# Patient Record
Sex: Female | Born: 1965 | Race: White | Hispanic: No | Smoking: Current some day smoker
Health system: Southern US, Community
[De-identification: ages and names within clinical notes are randomized; demographics above are authoritative.]

## PROBLEM LIST (undated history)

## (undated) DIAGNOSIS — M1990 Unspecified osteoarthritis, unspecified site: Secondary | ICD-10-CM

## (undated) DIAGNOSIS — R51 Headache: Secondary | ICD-10-CM

## (undated) DIAGNOSIS — E349 Endocrine disorder, unspecified: Secondary | ICD-10-CM

## (undated) DIAGNOSIS — F329 Major depressive disorder, single episode, unspecified: Secondary | ICD-10-CM

## (undated) DIAGNOSIS — F419 Anxiety disorder, unspecified: Secondary | ICD-10-CM

## (undated) DIAGNOSIS — T4145XA Adverse effect of unspecified anesthetic, initial encounter: Secondary | ICD-10-CM

## (undated) DIAGNOSIS — F32A Depression, unspecified: Secondary | ICD-10-CM

## (undated) DIAGNOSIS — N939 Abnormal uterine and vaginal bleeding, unspecified: Secondary | ICD-10-CM

## (undated) DIAGNOSIS — T8859XA Other complications of anesthesia, initial encounter: Secondary | ICD-10-CM

## (undated) HISTORY — DX: Anxiety disorder, unspecified: F41.9

## (undated) HISTORY — PX: GYNECOLOGIC CRYOSURGERY: SHX857

## (undated) HISTORY — DX: Endocrine disorder, unspecified: E34.9

## (undated) HISTORY — DX: Abnormal uterine and vaginal bleeding, unspecified: N93.9

---

## 1998-03-06 ENCOUNTER — Other Ambulatory Visit: Admission: RE | Admit: 1998-03-06 | Discharge: 1998-03-06 | Payer: Self-pay | Admitting: Obstetrics and Gynecology

## 1999-03-11 ENCOUNTER — Other Ambulatory Visit: Admission: RE | Admit: 1999-03-11 | Discharge: 1999-03-11 | Payer: Self-pay | Admitting: Obstetrics and Gynecology

## 1999-10-07 ENCOUNTER — Inpatient Hospital Stay (HOSPITAL_COMMUNITY): Admission: AD | Admit: 1999-10-07 | Discharge: 1999-10-11 | Payer: Self-pay | Admitting: Obstetrics and Gynecology

## 1999-10-28 ENCOUNTER — Encounter: Admission: RE | Admit: 1999-10-28 | Discharge: 2000-01-26 | Payer: Self-pay | Admitting: Gynecology

## 1999-11-05 ENCOUNTER — Other Ambulatory Visit: Admission: RE | Admit: 1999-11-05 | Discharge: 1999-11-05 | Payer: Self-pay | Admitting: Gynecology

## 2000-12-29 ENCOUNTER — Other Ambulatory Visit: Admission: RE | Admit: 2000-12-29 | Discharge: 2000-12-29 | Payer: Self-pay | Admitting: Gynecology

## 2002-01-09 ENCOUNTER — Other Ambulatory Visit: Admission: RE | Admit: 2002-01-09 | Discharge: 2002-01-09 | Payer: Self-pay | Admitting: Gynecology

## 2003-01-24 ENCOUNTER — Other Ambulatory Visit: Admission: RE | Admit: 2003-01-24 | Discharge: 2003-01-24 | Payer: Self-pay | Admitting: Gynecology

## 2004-03-19 ENCOUNTER — Other Ambulatory Visit: Admission: RE | Admit: 2004-03-19 | Discharge: 2004-03-19 | Payer: Self-pay | Admitting: Gynecology

## 2004-11-26 ENCOUNTER — Ambulatory Visit: Payer: Self-pay | Admitting: Internal Medicine

## 2005-02-02 ENCOUNTER — Ambulatory Visit: Payer: Self-pay | Admitting: Internal Medicine

## 2005-03-11 ENCOUNTER — Ambulatory Visit: Payer: Self-pay | Admitting: Internal Medicine

## 2005-03-18 ENCOUNTER — Ambulatory Visit: Payer: Self-pay | Admitting: Internal Medicine

## 2005-05-07 ENCOUNTER — Other Ambulatory Visit: Admission: RE | Admit: 2005-05-07 | Discharge: 2005-05-07 | Payer: Self-pay | Admitting: Gynecology

## 2006-04-06 ENCOUNTER — Ambulatory Visit: Payer: Self-pay | Admitting: Internal Medicine

## 2006-06-07 ENCOUNTER — Other Ambulatory Visit: Admission: RE | Admit: 2006-06-07 | Discharge: 2006-06-07 | Payer: Self-pay | Admitting: Gynecology

## 2007-05-11 DIAGNOSIS — J309 Allergic rhinitis, unspecified: Secondary | ICD-10-CM | POA: Insufficient documentation

## 2007-05-11 DIAGNOSIS — F39 Unspecified mood [affective] disorder: Secondary | ICD-10-CM | POA: Insufficient documentation

## 2007-07-21 ENCOUNTER — Other Ambulatory Visit: Admission: RE | Admit: 2007-07-21 | Discharge: 2007-07-21 | Payer: Self-pay | Admitting: Gynecology

## 2007-09-08 ENCOUNTER — Telehealth: Payer: Self-pay | Admitting: Internal Medicine

## 2011-01-16 NOTE — Discharge Summary (Signed)
[facility] [place]  Patient:    Margaret Brady, Margaret Brady                 MRN: [id number] Adm. Date:  [id number] Disc. Date: [id number] Attending:  Douglass Rivers Dictator:   Gwendolyn Fill. [place], P.A.                           Discharge Summary  DISCHARGE DIAGNOSES:          1. Intrauterine pregnancy at 39 weeks.                               2. Homero Fellers breech presentation in labor.                               3. Status post primary cesarean section low flap                                  transverse by Douglass Rivers, M.D. on February ,                                  2001.                               4. Rh negative.  HISTORY OF PRESENT ILLNESS:   This is a 45 year old female, gravida 2, para 0, abortus 1, with an EDC of October 16, 1999, whose prenatal course had been uncomplicated except for being rh negative and received RhoGAM in pregnancy.  HOSPITAL COURSE:              On October 08, 1999, the patient presented at 39 weeks with spontaneous rupture of membranes in labor and breech presentation. Therefore, the patient underwent a primary cesarean section low flap transverse on October 08, 1999, and at 0604, underwent delivery of a female, Apgars of 8 and , and weight of 7 pounds 1 ounce.  There were no complications.  Postoperatively, the patient remained afebrile, voiding, and in stable condition.  She was discharged to home on October 11, 1999, and given [facility] postpartum instructions as well as postpartum booklet.  ACCESSORY CLINICAL DATA:      The patient is A negative, rubella immune.  On October 09, 1999, hemoglobin 12.2, hematocrit 34.3.  DISPOSITION:                  The patient was discharged to home and informed to return to the office in six weeks and if she has any problems prior to that time to be seen in the office.  She was given Tylox #40 p.r.n. pain.  She is to receive  RhoGAM prior to discharge. DD:   11/03/99 TD:  11/03/99 Job: [id number]

## 2011-01-16 NOTE — Op Note (Signed)
[facility]  Patient:    Margaret Brady, Margaret Brady                 MRN: [id number] Proc. Date: 10/08/99 Adm. Date:  [id number] Attending:  Douglass Rivers                           Operative Report  PREOPERATIVE DIAGNOSIS:       Homero Fellers breech presentation.  Labor.  POSTOPERATIVE DIAGNOSIS:      Homero Fellers breech presentation.  Labor.  OPERATION:                    Primary cesarean section low flap transverse.  SURGEON:                      Douglass Rivers, M.D.  ASSISTANT:                    Bernette Redbird, M.D.  ANESTHESIA:                   Epidural.  ESTIMATED BLOOD LOSS:         600 cc.  FINDINGS:                     A viable female infant in the frank breech presentation.  Apgars were 8 and 9.  Birth weight was 3200 grams.  Normal uterus, tubes, and ovaries.  COMPLICATIONS:                None.  DESCRIPTION OF PROCEDURE:     The patient was taken to the operating room and placed in the supine position with a left lateral displacement.  She was prepped and draped in the usual sterile fashion.  After adequate anesthesia was ensured, a Pfannenstiel skin incision was made with the scalpel and carried through the underlying layer of fascia with the second blade.  The fascia was scored in the  midline and the incision was extended laterally with the Mayo scissors.  The inferior aspect of the fascial incision was grasped with Kochers.  Underlying rectus muscles were dissected off by blunt and sharp dissection.  In a similar fashion, the superior aspect of the fascial incision was grasped with Kochers and the underlying rectus muscles were separated off.  The rectus muscles were separated in the midline and the peritoneum was identified, tented up, and entered by sharp dissection.  The peritoneal incision was then extended superiorly and inferiorly with good visualization of underlying bowel and bladder.  The orientation of the uterus was confirmed,  the vesicouterine peritoneum was identified, tented up, and entered sharply with the Metzenbaum scissors.  The incision was extended laterally.  The bladder flap was created digitally.  The bladder blade was reinserted and the lower uterine segment was incised in a transverse fashion with the scalpel.  The infant was delivered from the frank breech presentation using the usual maneuvers.  The cord was cut and clamped and she was handed off to the awaiting pediatrician.  Cord bloods were obtained. The uterus was massaged.  The placenta was allowed to separate naturally.  The uterus was then cleared of all clots and debris.  The uterine incision was then closed  with a running locking suture of 0 chromic.  The gutters were cleared of all clots and debris.  The adnexa were inspected.  The incision was noted to be hemostatic as  was the peritoneum and muscle.  The pelvis was irrigated with copious amounts of warm saline.  The fascia was then closed with 0 Vicryl starting at the apex and  moving towards the midline bilaterally.  The subcu was irrigated.  The skin was  closed with staples.  The patient tolerated the procedure well.  Sponge, needle, and instrument counts were correct x 2.  She was given Ancef intraoperatively and transferred to PACU in stable condition. DD:  10/08/99 TD:  10/08/99 Job: [id number]

## 2013-11-09 ENCOUNTER — Other Ambulatory Visit: Payer: Self-pay | Admitting: Family Medicine

## 2013-11-09 ENCOUNTER — Ambulatory Visit
Admission: RE | Admit: 2013-11-09 | Discharge: 2013-11-09 | Disposition: A | Discharge status: Home / Self Care | Payer: No Typology Code available for payment source | Source: Ambulatory Visit | Attending: Family Medicine | Admitting: Family Medicine

## 2013-11-09 DIAGNOSIS — R945 Abnormal results of liver function studies: Secondary | ICD-10-CM

## 2013-11-09 DIAGNOSIS — R109 Unspecified abdominal pain: Secondary | ICD-10-CM

## 2013-11-09 DIAGNOSIS — R7989 Other specified abnormal findings of blood chemistry: Secondary | ICD-10-CM

## 2013-11-13 ENCOUNTER — Encounter (INDEPENDENT_AMBULATORY_CARE_PROVIDER_SITE_OTHER): Payer: Self-pay | Admitting: General Surgery

## 2013-11-13 ENCOUNTER — Telehealth (INDEPENDENT_AMBULATORY_CARE_PROVIDER_SITE_OTHER): Payer: Self-pay

## 2013-11-13 ENCOUNTER — Ambulatory Visit (INDEPENDENT_AMBULATORY_CARE_PROVIDER_SITE_OTHER): Payer: Self-pay | Admitting: General Surgery

## 2013-11-13 VITALS — BP 122/88 | HR 72 | Temp 98.2°F | Resp 16 | Ht 64.0 in | Wt 162.2 lb

## 2013-11-13 DIAGNOSIS — K8020 Calculus of gallbladder without cholecystitis without obstruction: Secondary | ICD-10-CM

## 2013-11-13 NOTE — Telephone Encounter (Signed)
Office notes faxed to Dr. Electa SniffBarnett from 11/13/13 OV with Dr. Derrell Lollingamirez @ [phone]. Fax confirmation rec'd.

## 2013-11-13 NOTE — Progress Notes (Signed)
Patient ID: Margaret Brady, female   DOB: 1966-07-14, 48 y.o.   MRN: [id number]  Chief Complaint  Patient presents with  . New Evaluation    Gallstones    HPI Margaret Brady is a 48 y.o. female.  The patient is a 48 year old female by Dr. Electa SniffBarnett for evaluation of symptomatic cholelithiasis. The patient states she's had a one-week history of right upper quadrant epigastric pain that radiates to the back. The patient undergone ultrasound which revealed multiple gallstones and a normal common bile duct. The patient has had laboratory studies revealed elevated transaminases with a normal T. Bili.    HPI  History reviewed. No pertinent past medical history.  Past Surgical History  Procedure Laterality Date  . Cesarean section      2001.    History reviewed. No pertinent family history.  Social History History  Substance Use Topics  . Smoking status: Current Some Day Smoker  . Smokeless tobacco: Never Used  . Alcohol Use: No    No Known Allergies  Current Outpatient Prescriptions  Medication Sig Dispense Refill  . norethindrone-ethinyl estradiol (FEMHRT 1/5) 1-5 MG-MCG TABS Take by mouth daily.       No current facility-administered medications for this visit.    Review of Systems Review of Systems  Constitutional: Negative.   HENT: Negative.   Respiratory: Negative.   Cardiovascular: Negative.   Gastrointestinal: Negative.   Neurological: Negative.   All other systems reviewed and are negative.    Blood pressure 122/88, pulse 72, temperature 98.2 F (36.8 C), temperature source Temporal, resp. rate 16, height 5\' 4"  (1.626 m), weight 162 lb 3.2 oz (73.573 kg).  Physical Exam Physical Exam  Constitutional: She is oriented to person, place, and time. She appears well-developed and well-nourished.  HENT:  Head: Normocephalic and atraumatic.  Eyes: Conjunctivae and EOM are normal. Pupils are equal, round, and reactive to light.  Neck: Normal range of motion. Neck  supple.  Cardiovascular: Normal rate, regular rhythm and normal heart sounds.   Pulmonary/Chest: Effort normal and breath sounds normal.  Abdominal: Soft. Bowel sounds are normal. She exhibits no distension and no mass. There is tenderness (ruq). There is no rebound and no guarding.  Musculoskeletal: Normal range of motion.  Neurological: She is alert and oriented to person, place, and time.  Skin: Skin is warm and dry.  Psychiatric: She has a normal mood and affect.    Data Reviewed As above  Assessment    48 year old female with symptomatic cholelithiasis     Plan    1. We'll proceed to the operating room for a laparoscopic cholecystectomy with IOC 2. All risks and benefits were discussed with the patient to generally include: infection, bleeding, possible need for post op ERCP, damage to the bile ducts, and bile leak. Alternatives were offered and described.  All questions were answered and the patient voiced understanding of the procedure and wishes to proceed at this point with a laparoscopic cholecystectomy         Marigene EhlersRamirez Jr., Jed LimerickArmando 11/13/2013, 2:42 PM

## 2013-11-14 ENCOUNTER — Encounter (HOSPITAL_COMMUNITY): Payer: Self-pay | Admitting: Pharmacy Technician

## 2013-11-14 NOTE — Pre-Procedure Instructions (Addendum)
Margaret Brady  11/14/2013   Your procedure is scheduled on:  11/16/13  [facility] at 530 AM.  Call this number if you have problems the morning of surgery: [phone]   Remember:   Do not eat food or drink liquids after midnight.   Take these medicines the morning of surgery with A SIP OF WATER: tylenol if needed, birth control        STOP all herbel meds, nsaids (aleve,naproxen,advil,ibuprofen) now including vitamins,aspirin   Do not wear jewelry, make-up or nail polish.  Do not wear lotions, powders, or perfumes. You may wear deodorant.  Do not shave 48 hours prior to surgery. Men may shave face and neck.  Do not bring valuables to the hospital.  [facility] is not responsible                  for any belongings or valuables.               Contacts, dentures or bridgework may not be worn into surgery.  Leave suitcase in the car. After surgery it may be brought to your room.  For patients admitted to the hospital, discharge time is determined by your                treatment team.               Patients discharged the day of surgery will not be allowed to drive  home.  Name and phone number of your driver:   Special Instructions:  Special Instructions: [place] - Preparing for Surgery  Before surgery, you can play an important role.  Because skin is not sterile, your skin needs to be as free of germs as possible.  You can reduce the number of germs on you skin by washing with CHG (chlorahexidine gluconate) soap before surgery.  CHG is an antiseptic cleaner which kills germs and bonds with the skin to continue killing germs even after washing.  Please DO NOT use if you have an allergy to CHG or antibacterial soaps.  If your skin becomes reddened/irritated stop using the CHG and inform your nurse when you arrive at Short Stay.  Do not shave (including legs and underarms) for at least 48 hours prior to the first CHG shower.  You may shave your  face.  Please follow these instructions carefully:   1.  Shower with CHG Soap the night before surgery and the morning of Surgery.  2.  If you choose to wash your hair, wash your hair first as usual with your normal shampoo.  3.  After you shampoo, rinse your hair and body thoroughly to remove the Shampoo.  4.  Use CHG as you would any other liquid soap.  You can apply chg directly  to the skin and wash gently with scrungie or a clean washcloth.  5.  Apply the CHG Soap to your body ONLY FROM THE NECK DOWN.  Do not use on open wounds or open sores.  Avoid contact with your eyes ears, mouth and genitals (private parts).  Wash genitals (private parts)       with your normal soap.  6.  Wash thoroughly, paying special attention to the area where your surgery will be performed.  7.  Thoroughly rinse your body with warm water from the neck down.  8.  DO NOT shower/wash with your normal soap after using and rinsing off the CHG Soap.  9.  Pat yourself dry with a clean towel.            10.  Wear clean pajamas.            11.  Place clean sheets on your bed the night of your first shower and do not sleep with pets.  Day of Surgery  Do not apply any lotions/deodorants the morning of surgery.  Please wear clean clothes to the hospital/surgery center.   Please read over the following fact sheets that you were given: Pain Booklet, Coughing and Deep Breathing and Surgical Site Infection Prevention

## 2013-11-15 ENCOUNTER — Encounter (HOSPITAL_COMMUNITY): Payer: Self-pay

## 2013-11-15 ENCOUNTER — Encounter (HOSPITAL_COMMUNITY)
Admission: RE | Admit: 2013-11-15 | Discharge: 2013-11-15 | Disposition: A | Discharge status: Home / Self Care | Payer: Medicaid Other | Source: Ambulatory Visit | Attending: General Surgery | Admitting: General Surgery

## 2013-11-15 DIAGNOSIS — Z01812 Encounter for preprocedural laboratory examination: Secondary | ICD-10-CM | POA: Diagnosis present

## 2013-11-15 HISTORY — DX: Other complications of anesthesia, initial encounter: T88.59XA

## 2013-11-15 HISTORY — DX: Adverse effect of unspecified anesthetic, initial encounter: T41.45XA

## 2013-11-15 HISTORY — DX: Depression, unspecified: F32.A

## 2013-11-15 HISTORY — DX: Major depressive disorder, single episode, unspecified: F32.9

## 2013-11-15 HISTORY — DX: Headache: R51

## 2013-11-15 HISTORY — DX: Unspecified osteoarthritis, unspecified site: M19.90

## 2013-11-15 LAB — BASIC METABOLIC PANEL
BUN: 12 mg/dL (ref 6–23)
CALCIUM: 10 mg/dL (ref 8.4–10.5)
CO2: 24 mEq/L (ref 19–32)
CREATININE: 0.75 mg/dL (ref 0.50–1.10)
Chloride: 103 mEq/L (ref 96–112)
GFR calc Af Amer: >90 mL/min (ref >90)
Glucose, Bld: 96 mg/dL (ref 70–99)
POTASSIUM: 5.1 meq/L (ref 3.7–5.3)
Sodium: 140 mEq/L (ref 137–147)

## 2013-11-15 LAB — CBC
HCT: 43.4 % (ref 36.0–46.0)
HEMOGLOBIN: 15.8 g/dL — AB (ref 12.0–15.0)
MCH: 36.7 pg — ABNORMAL HIGH (ref 26.0–34.0)
MCHC: 36.4 g/dL — AB (ref 30.0–36.0)
MCV: 100.9 fL — ABNORMAL HIGH (ref 78.0–100.0)
Platelets: 267 10*3/uL (ref 150–400)
RBC: 4.3 MIL/uL (ref 3.87–5.11)
RDW: 12.2 % (ref 11.5–15.5)
WBC: 7 10*3/uL (ref 4.0–10.5)

## 2013-11-15 LAB — HCG, SERUM, QUALITATIVE: PREG SERUM: NEGATIVE

## 2013-11-15 MED ORDER — CEFAZOLIN SODIUM-DEXTROSE 2-3 GM-% IV SOLR
2.0000 g | INTRAVENOUS | Status: AC
  Administered 2013-11-16: 2 g via INTRAVENOUS
  Filled 2013-11-15: qty 50

## 2013-11-16 ENCOUNTER — Inpatient Hospital Stay (HOSPITAL_COMMUNITY): Payer: Medicaid Other | Admitting: Certified Registered"

## 2013-11-16 ENCOUNTER — Inpatient Hospital Stay (HOSPITAL_COMMUNITY): Payer: Medicaid Other

## 2013-11-16 ENCOUNTER — Encounter (HOSPITAL_COMMUNITY): Payer: Self-pay | Admitting: *Deleted

## 2013-11-16 ENCOUNTER — Encounter (HOSPITAL_COMMUNITY): Payer: Medicaid Other | Admitting: Certified Registered"

## 2013-11-16 ENCOUNTER — Ambulatory Visit (HOSPITAL_COMMUNITY): Payer: Medicaid Other | Admitting: Anesthesiology

## 2013-11-16 ENCOUNTER — Encounter (HOSPITAL_COMMUNITY)
Admission: RE | Disposition: A | Discharge status: Home / Self Care | Payer: Self-pay | Source: Ambulatory Visit | Attending: General Surgery

## 2013-11-16 ENCOUNTER — Inpatient Hospital Stay (HOSPITAL_COMMUNITY)
Admission: RE | Admit: 2013-11-16 | Discharge: 2013-11-17 | DRG: 419 | Disposition: A | Discharge status: Home / Self Care | Payer: Medicaid Other | Source: Ambulatory Visit | Attending: General Surgery | Admitting: General Surgery

## 2013-11-16 ENCOUNTER — Ambulatory Visit (HOSPITAL_COMMUNITY): Payer: Medicaid Other

## 2013-11-16 ENCOUNTER — Encounter (HOSPITAL_COMMUNITY): Payer: Medicaid Other | Admitting: Anesthesiology

## 2013-11-16 DIAGNOSIS — K8050 Calculus of bile duct without cholangitis or cholecystitis without obstruction: Secondary | ICD-10-CM

## 2013-11-16 DIAGNOSIS — Z79899 Other long term (current) drug therapy: Secondary | ICD-10-CM

## 2013-11-16 DIAGNOSIS — Z9049 Acquired absence of other specified parts of digestive tract: Secondary | ICD-10-CM

## 2013-11-16 DIAGNOSIS — K8070 Calculus of gallbladder and bile duct without cholecystitis without obstruction: Principal | ICD-10-CM | POA: Diagnosis present

## 2013-11-16 DIAGNOSIS — K8010 Calculus of gallbladder with chronic cholecystitis without obstruction: Secondary | ICD-10-CM

## 2013-11-16 DIAGNOSIS — K8020 Calculus of gallbladder without cholecystitis without obstruction: Secondary | ICD-10-CM

## 2013-11-16 HISTORY — PX: ERCP: SHX5425

## 2013-11-16 HISTORY — PX: CHOLECYSTECTOMY: SHX55

## 2013-11-16 SURGERY — ERCP, WITH INTERVENTION IF INDICATED
Anesthesia: General

## 2013-11-16 SURGERY — LAPAROSCOPIC CHOLECYSTECTOMY WITH INTRAOPERATIVE CHOLANGIOGRAM
Anesthesia: General

## 2013-11-16 MED ORDER — SODIUM CHLORIDE 0.9 % IV SOLN
INTRAVENOUS | Status: DC | PRN
  Administered 2013-11-16: 18:00:00

## 2013-11-16 MED ORDER — ONDANSETRON HCL 4 MG/2ML IJ SOLN
INTRAMUSCULAR | Status: AC
  Filled 2013-11-16: qty 2

## 2013-11-16 MED ORDER — KETOROLAC TROMETHAMINE 30 MG/ML IJ SOLN
INTRAMUSCULAR | Status: DC | PRN
  Administered 2013-11-16: 30 mg via INTRAVENOUS

## 2013-11-16 MED ORDER — NEOSTIGMINE METHYLSULFATE 1 MG/ML IJ SOLN
INTRAMUSCULAR | Status: AC
  Filled 2013-11-16: qty 10

## 2013-11-16 MED ORDER — GLUCAGON HCL (RDNA) 1 MG IJ SOLR
INTRAMUSCULAR | Status: AC
  Filled 2013-11-16: qty 1

## 2013-11-16 MED ORDER — LACTATED RINGERS IV SOLN
INTRAVENOUS | Status: DC | PRN
  Administered 2013-11-16: 17:00:00 via INTRAVENOUS

## 2013-11-16 MED ORDER — MIDAZOLAM HCL 2 MG/2ML IJ SOLN
INTRAMUSCULAR | Status: AC
  Filled 2013-11-16: qty 2

## 2013-11-16 MED ORDER — NEOSTIGMINE METHYLSULFATE 1 MG/ML IJ SOLN
INTRAMUSCULAR | Status: DC | PRN
  Administered 2013-11-16: 3 mg via INTRAVENOUS

## 2013-11-16 MED ORDER — FENTANYL CITRATE 0.05 MG/ML IJ SOLN
INTRAMUSCULAR | Status: AC
  Filled 2013-11-16: qty 5

## 2013-11-16 MED ORDER — ROCURONIUM BROMIDE 100 MG/10ML IV SOLN
INTRAVENOUS | Status: DC | PRN
  Administered 2013-11-16: 25 mg via INTRAVENOUS

## 2013-11-16 MED ORDER — PROMETHAZINE HCL 25 MG/ML IJ SOLN
INTRAMUSCULAR | Status: AC
  Administered 2013-11-16: 6.25 mg via INTRAVENOUS
  Filled 2013-11-16: qty 1

## 2013-11-16 MED ORDER — MEPERIDINE HCL 25 MG/ML IJ SOLN: 6.2500 mg | INTRAMUSCULAR | Status: DC | PRN

## 2013-11-16 MED ORDER — CHLORHEXIDINE GLUCONATE 4 % EX LIQD
1.0000 "application " | Freq: Once | CUTANEOUS | Status: DC
  Filled 2013-11-16: qty 15

## 2013-11-16 MED ORDER — LIDOCAINE HCL (CARDIAC) 20 MG/ML IV SOLN
INTRAVENOUS | Status: DC | PRN
  Administered 2013-11-16: 80 mg via INTRAVENOUS

## 2013-11-16 MED ORDER — FENTANYL CITRATE 0.05 MG/ML IJ SOLN
INTRAMUSCULAR | Status: DC | PRN
  Administered 2013-11-16: 50 ug via INTRAVENOUS
  Administered 2013-11-16 (×2): 100 ug via INTRAVENOUS

## 2013-11-16 MED ORDER — PANTOPRAZOLE SODIUM 40 MG IV SOLR
40.0000 mg | Freq: Every day | INTRAVENOUS | Status: DC
  Administered 2013-11-16: 40 mg via INTRAVENOUS
  Filled 2013-11-16 (×2): qty 40

## 2013-11-16 MED ORDER — GLYCOPYRROLATE 0.2 MG/ML IJ SOLN
INTRAMUSCULAR | Status: DC | PRN
  Administered 2013-11-16: 0.4 mg via INTRAVENOUS

## 2013-11-16 MED ORDER — OXYCODONE HCL 5 MG PO TABS: 5.0000 mg | ORAL_TABLET | Freq: Once | ORAL | Status: DC | PRN

## 2013-11-16 MED ORDER — LACTATED RINGERS IV SOLN
INTRAVENOUS | Status: DC | PRN
  Administered 2013-11-16: 07:00:00 via INTRAVENOUS

## 2013-11-16 MED ORDER — HYDROMORPHONE HCL PF 1 MG/ML IJ SOLN
0.2500 mg | INTRAMUSCULAR | Status: DC | PRN
  Administered 2013-11-16 (×2): 0.5 mg via INTRAVENOUS

## 2013-11-16 MED ORDER — HYDROMORPHONE HCL PF 1 MG/ML IJ SOLN
INTRAMUSCULAR | Status: AC
  Filled 2013-11-16: qty 1

## 2013-11-16 MED ORDER — ROCURONIUM BROMIDE 50 MG/5ML IV SOLN
INTRAVENOUS | Status: AC
  Filled 2013-11-16: qty 1

## 2013-11-16 MED ORDER — LIDOCAINE HCL (CARDIAC) 20 MG/ML IV SOLN
INTRAVENOUS | Status: AC
  Filled 2013-11-16: qty 5

## 2013-11-16 MED ORDER — HYDROMORPHONE HCL PF 1 MG/ML IJ SOLN
0.2500 mg | INTRAMUSCULAR | Status: DC | PRN
  Administered 2013-11-16 (×3): 0.5 mg via INTRAVENOUS

## 2013-11-16 MED ORDER — SODIUM CHLORIDE 0.9 % IR SOLN
Status: DC | PRN
  Administered 2013-11-16: 1000 mL

## 2013-11-16 MED ORDER — PROPOFOL 10 MG/ML IV BOLUS
INTRAVENOUS | Status: DC | PRN
  Administered 2013-11-16: 120 mg via INTRAVENOUS

## 2013-11-16 MED ORDER — ONDANSETRON HCL 4 MG/2ML IJ SOLN
INTRAMUSCULAR | Status: DC | PRN
  Administered 2013-11-16: 4 mg via INTRAVENOUS

## 2013-11-16 MED ORDER — BUPIVACAINE HCL (PF) 0.25 % IJ SOLN
INTRAMUSCULAR | Status: AC
  Filled 2013-11-16: qty 30

## 2013-11-16 MED ORDER — KETOROLAC TROMETHAMINE 30 MG/ML IJ SOLN
INTRAMUSCULAR | Status: AC
  Filled 2013-11-16: qty 1

## 2013-11-16 MED ORDER — ONDANSETRON HCL 4 MG/2ML IJ SOLN: 4.0000 mg | Freq: Four times a day (QID) | INTRAMUSCULAR | Status: DC | PRN

## 2013-11-16 MED ORDER — MIDAZOLAM HCL 2 MG/2ML IJ SOLN: 0.5000 mg | Freq: Once | INTRAMUSCULAR | Status: DC

## 2013-11-16 MED ORDER — ROCURONIUM BROMIDE 100 MG/10ML IV SOLN
INTRAVENOUS | Status: DC | PRN
  Administered 2013-11-16: 40 mg via INTRAVENOUS

## 2013-11-16 MED ORDER — HYDROMORPHONE HCL PF 1 MG/ML IJ SOLN
INTRAMUSCULAR | Status: AC
  Administered 2013-11-16: 0.5 mg via INTRAVENOUS
  Filled 2013-11-16: qty 1

## 2013-11-16 MED ORDER — PROPOFOL 10 MG/ML IV BOLUS
INTRAVENOUS | Status: DC | PRN
  Administered 2013-11-16: 180 mg via INTRAVENOUS

## 2013-11-16 MED ORDER — PROMETHAZINE HCL 25 MG/ML IJ SOLN
6.2500 mg | INTRAMUSCULAR | Status: DC | PRN
  Administered 2013-11-16: 6.25 mg via INTRAVENOUS

## 2013-11-16 MED ORDER — DEXTROSE-NACL 5-0.9 % IV SOLN
INTRAVENOUS | Status: DC
  Administered 2013-11-16 – 2013-11-17 (×2): via INTRAVENOUS

## 2013-11-16 MED ORDER — PIPERACILLIN-TAZOBACTAM 3.375 G IVPB
3.3750 g | Freq: Three times a day (TID) | INTRAVENOUS | Status: DC
  Administered 2013-11-16 – 2013-11-17 (×3): 3.375 g via INTRAVENOUS
  Filled 2013-11-16 (×6): qty 50

## 2013-11-16 MED ORDER — PROMETHAZINE HCL 25 MG/ML IJ SOLN
INTRAMUSCULAR | Status: AC
  Filled 2013-11-16: qty 1

## 2013-11-16 MED ORDER — SODIUM CHLORIDE 0.9 % IV SOLN: INTRAVENOUS | Status: DC

## 2013-11-16 MED ORDER — BUPIVACAINE HCL 0.25 % IJ SOLN
INTRAMUSCULAR | Status: DC | PRN
  Administered 2013-11-16: 9 mL
  Administered 2013-11-16: 2 mL

## 2013-11-16 MED ORDER — HYDROMORPHONE HCL PF 1 MG/ML IJ SOLN
1.0000 mg | INTRAMUSCULAR | Status: DC | PRN
  Administered 2013-11-16: 1 mg via INTRAVENOUS
  Filled 2013-11-16: qty 1

## 2013-11-16 MED ORDER — FENTANYL CITRATE 0.05 MG/ML IJ SOLN
INTRAMUSCULAR | Status: DC | PRN
  Administered 2013-11-16 (×2): 50 ug via INTRAVENOUS

## 2013-11-16 MED ORDER — PROPOFOL 10 MG/ML IV BOLUS
INTRAVENOUS | Status: AC
  Filled 2013-11-16: qty 20

## 2013-11-16 MED ORDER — OXYCODONE HCL 5 MG/5ML PO SOLN: 5.0000 mg | Freq: Once | ORAL | Status: DC | PRN

## 2013-11-16 MED ORDER — 0.9 % SODIUM CHLORIDE (POUR BTL) OPTIME
TOPICAL | Status: DC | PRN
  Administered 2013-11-16: 1000 mL

## 2013-11-16 MED ORDER — MIDAZOLAM HCL 5 MG/5ML IJ SOLN
INTRAMUSCULAR | Status: DC | PRN
  Administered 2013-11-16: 2 mg via INTRAVENOUS

## 2013-11-16 MED ORDER — IOHEXOL 300 MG/ML  SOLN
INTRAMUSCULAR | Status: DC | PRN
  Administered 2013-11-16: 30 mL via INTRAVENOUS

## 2013-11-16 MED ORDER — OXYCODONE HCL 5 MG PO TABS
5.0000 mg | ORAL_TABLET | ORAL | Status: DC | PRN
  Administered 2013-11-16 – 2013-11-17 (×3): 5 mg via ORAL
  Filled 2013-11-16 (×3): qty 1

## 2013-11-16 MED ORDER — LIDOCAINE HCL (CARDIAC) 20 MG/ML IV SOLN
INTRAVENOUS | Status: DC | PRN
  Administered 2013-11-16: 30 mg via INTRAVENOUS

## 2013-11-16 MED ORDER — GLUCAGON HCL (RDNA) 1 MG IJ SOLR
INTRAMUSCULAR | Status: DC | PRN
  Administered 2013-11-16: 2 mg via INTRAVENOUS

## 2013-11-16 MED ORDER — GLYCOPYRROLATE 0.2 MG/ML IJ SOLN
INTRAMUSCULAR | Status: AC
  Filled 2013-11-16: qty 2

## 2013-11-16 SURGICAL SUPPLY — 54 items
APL SKNCLS STERI-STRIP NONHPOA (GAUZE/BANDAGES/DRESSINGS) ×1
APPLIER CLIP 5 13 M/L LIGAMAX5 (MISCELLANEOUS) ×3
APR CLP MED LRG 5 ANG JAW (MISCELLANEOUS) ×1
BAG SPEC RTRVL LRG 6X4 10 (ENDOMECHANICALS)
BENZOIN TINCTURE PRP APPL 2/3 (GAUZE/BANDAGES/DRESSINGS) ×3 IMPLANT
CANISTER SUCTION 2500CC (MISCELLANEOUS) ×3 IMPLANT
CHLORAPREP W/TINT 26ML (MISCELLANEOUS) ×3 IMPLANT
CLIP APPLIE 5 13 M/L LIGAMAX5 (MISCELLANEOUS) IMPLANT
CLIP LIGATING HEMO O LOK GREEN (MISCELLANEOUS) ×3 IMPLANT
CLOSURE WOUND 1/2 X4 (GAUZE/BANDAGES/DRESSINGS) ×1
COVER MAYO STAND STRL (DRAPES) ×3 IMPLANT
COVER SURGICAL LIGHT HANDLE (MISCELLANEOUS) ×3 IMPLANT
COVER TRANSDUCER ULTRASND (DRAPES) ×2 IMPLANT
DEVICE TROCAR PUNCTURE CLOSURE (ENDOMECHANICALS) ×3 IMPLANT
DRAPE C-ARM 42X72 X-RAY (DRAPES) ×3 IMPLANT
DRAPE UTILITY 15X26 W/TAPE STR (DRAPE) ×6 IMPLANT
ELECT REM PT RETURN 9FT ADLT (ELECTROSURGICAL) ×3
ELECTRODE REM PT RTRN 9FT ADLT (ELECTROSURGICAL) ×1 IMPLANT
ENDOLOOP SUT PDS II  0 18 (SUTURE) ×2
ENDOLOOP SUT PDS II 0 18 (SUTURE) IMPLANT
GAUZE SPONGE 2X2 8PLY STRL LF (GAUZE/BANDAGES/DRESSINGS) ×1 IMPLANT
GLOVE BIO SURGEON STRL SZ7 (GLOVE) ×2 IMPLANT
GLOVE BIO SURGEON STRL SZ7.5 (GLOVE) ×5 IMPLANT
GLOVE BIOGEL PI IND STRL 7.0 (GLOVE) IMPLANT
GLOVE BIOGEL PI IND STRL 7.5 (GLOVE) IMPLANT
GLOVE BIOGEL PI INDICATOR 7.0 (GLOVE) ×4
GLOVE BIOGEL PI INDICATOR 7.5 (GLOVE) ×4
GLOVE ECLIPSE 7.0 STRL STRAW (GLOVE) ×2 IMPLANT
GOWN STRL REUS W/ TWL LRG LVL3 (GOWN DISPOSABLE) ×3 IMPLANT
GOWN STRL REUS W/ TWL XL LVL3 (GOWN DISPOSABLE) ×1 IMPLANT
GOWN STRL REUS W/TWL LRG LVL3 (GOWN DISPOSABLE) ×9
GOWN STRL REUS W/TWL XL LVL3 (GOWN DISPOSABLE) ×3
IV CATH 14GX2 1/4 (CATHETERS) ×3 IMPLANT
KIT BASIN OR (CUSTOM PROCEDURE TRAY) ×3 IMPLANT
KIT ROOM TURNOVER OR (KITS) ×3 IMPLANT
NDL INSUFFLATION 14GA 120MM (NEEDLE) ×1 IMPLANT
NEEDLE INSUFFLATION 14GA 120MM (NEEDLE) ×3 IMPLANT
NS IRRIG 1000ML POUR BTL (IV SOLUTION) ×3 IMPLANT
PAD ARMBOARD 7.5X6 YLW CONV (MISCELLANEOUS) ×6 IMPLANT
POUCH SPECIMEN RETRIEVAL 10MM (ENDOMECHANICALS) IMPLANT
SCISSORS LAP 5X35 DISP (ENDOMECHANICALS) ×3 IMPLANT
SET CHOLANGIOGRAPHY FRANKLIN (SET/KITS/TRAYS/PACK) ×3 IMPLANT
SET IRRIG TUBING LAPAROSCOPIC (IRRIGATION / IRRIGATOR) ×3 IMPLANT
SLEEVE ENDOPATH XCEL 5M (ENDOMECHANICALS) ×5 IMPLANT
SPECIMEN JAR SMALL (MISCELLANEOUS) ×3 IMPLANT
SPONGE GAUZE 2X2 STER 10/PKG (GAUZE/BANDAGES/DRESSINGS) ×2
STRIP CLOSURE SKIN 1/2X4 (GAUZE/BANDAGES/DRESSINGS) ×1 IMPLANT
SUT MNCRL AB 3-0 PS2 18 (SUTURE) ×5 IMPLANT
TAPE CLOTH SOFT 2X10 (GAUZE/BANDAGES/DRESSINGS) ×2 IMPLANT
TOWEL OR 17X24 6PK STRL BLUE (TOWEL DISPOSABLE) ×3 IMPLANT
TOWEL OR 17X26 10 PK STRL BLUE (TOWEL DISPOSABLE) ×3 IMPLANT
TRAY LAPAROSCOPIC (CUSTOM PROCEDURE TRAY) ×3 IMPLANT
TROCAR XCEL NON-BLD 11X100MML (ENDOMECHANICALS) ×3 IMPLANT
TROCAR XCEL NON-BLD 5MMX100MML (ENDOMECHANICALS) ×3 IMPLANT

## 2013-11-16 NOTE — H&P (View-Only) (Signed)
Patient ID: Margaret Brady, female   DOB: 02/02/1966, 48 y.o.   MRN: [id number]  Chief Complaint  Patient presents with  . New Evaluation    Gallstones    HPI Margaret Brady is a 48 y.o. female.  The patient is a 48-year-old female by Dr. Barnett for evaluation of symptomatic cholelithiasis. The patient states she's had a one-week history of right upper quadrant epigastric pain that radiates to the back. The patient undergone ultrasound which revealed multiple gallstones and a normal common bile duct. The patient has had laboratory studies revealed elevated transaminases with a normal T. Bili.    HPI  History reviewed. No pertinent past medical history.  Past Surgical History  Procedure Laterality Date  . Cesarean section      2001.    History reviewed. No pertinent family history.  Social History History  Substance Use Topics  . Smoking status: Current Some Day Smoker  . Smokeless tobacco: Never Used  . Alcohol Use: No    No Known Allergies  Current Outpatient Prescriptions  Medication Sig Dispense Refill  . norethindrone-ethinyl estradiol (FEMHRT 1/5) 1-5 MG-MCG TABS Take by mouth daily.       No current facility-administered medications for this visit.    Review of Systems Review of Systems  Constitutional: Negative.   HENT: Negative.   Respiratory: Negative.   Cardiovascular: Negative.   Gastrointestinal: Negative.   Neurological: Negative.   All other systems reviewed and are negative.    Blood pressure 122/88, pulse 72, temperature 98.2 F (36.8 C), temperature source Temporal, resp. rate 16, height 5' 4" (1.626 m), weight 162 lb 3.2 oz (73.573 kg).  Physical Exam Physical Exam  Constitutional: She is oriented to person, place, and time. She appears well-developed and well-nourished.  HENT:  Head: Normocephalic and atraumatic.  Eyes: Conjunctivae and EOM are normal. Pupils are equal, round, and reactive to light.  Neck: Normal range of motion. Neck  supple.  Cardiovascular: Normal rate, regular rhythm and normal heart sounds.   Pulmonary/Chest: Effort normal and breath sounds normal.  Abdominal: Soft. Bowel sounds are normal. She exhibits no distension and no mass. There is tenderness (ruq). There is no rebound and no guarding.  Musculoskeletal: Normal range of motion.  Neurological: She is alert and oriented to person, place, and time.  Skin: Skin is warm and dry.  Psychiatric: She has a normal mood and affect.    Data Reviewed As above  Assessment    48-year-old female with symptomatic cholelithiasis     Plan    1. We'll proceed to the operating room for a laparoscopic cholecystectomy with IOC 2. All risks and benefits were discussed with the patient to generally include: infection, bleeding, possible need for post op ERCP, damage to the bile ducts, and bile leak. Alternatives were offered and described.  All questions were answered and the patient voiced understanding of the procedure and wishes to proceed at this point with a laparoscopic cholecystectomy         Bret Vanessen Jr., Jahmeer Porche 11/13/2013, 2:42 PM    

## 2013-11-16 NOTE — Anesthesia Preprocedure Evaluation (Addendum)
Anesthesia Evaluation  Patient identified by MRN, date of birth, ID band Patient awake    Reviewed: Allergy & Precautions, H&P , NPO status , Patient's Chart, lab work & pertinent test results  History of Anesthesia Complications (+) PONV and history of anesthetic complications  Airway Mallampati: I TM Distance: >3 FB Neck ROM: Full    Dental  (+) Teeth Intact, Dental Advisory Given   Pulmonary Current Smoker,  breath sounds clear to auscultation  Pulmonary exam normal       Cardiovascular negative cardio ROS  Rhythm:Regular Rate:Normal     Neuro/Psych  Headaches, Depression    GI/Hepatic Neg liver ROS, GERD-  Medicated and Controlled,  Endo/Other  negative endocrine ROS  Renal/GU negative Renal ROS     Musculoskeletal   Abdominal   Peds  Hematology   Anesthesia Other Findings   Reproductive/Obstetrics 11/15/13 preg test: NEG                       Anesthesia Physical Anesthesia Plan  ASA: II  Anesthesia Plan: General   Post-op Pain Management:    Induction: Intravenous  Airway Management Planned: Oral ETT  Additional Equipment:   Intra-op Plan:   Post-operative Plan: Extubation in OR  Informed Consent: I have reviewed the patients History and Physical, chart, labs and discussed the procedure including the risks, benefits and alternatives for the proposed anesthesia with the patient or authorized representative who has indicated his/her understanding and acceptance.   Dental advisory given  Plan Discussed with: Anesthesiologist, Surgeon and CRNA  Anesthesia Plan Comments: (Plan routine monitors, GETA)       Anesthesia Quick Evaluation

## 2013-11-16 NOTE — Op Note (Signed)
[facility] [address] [place] [id number]   ERCP PROCEDURE REPORT  PATIENT: Margaret Brady, Margaret M.  MR# :[id number] BIRTHDATE: 04/21/1966  GENDER: Female ENDOSCOPIST: Louis Meckelobert D Kaplan, MD REFERRED BY: PROCEDURE DATE:  11/16/2013 PROCEDURE:   ERCP with sphincterotomy/papillotomy and ERCP with removal of calculus/calculi ASA CLASS:   Class II INDICATIONS:suspected or rule out bile duct stones. MEDICATIONS: MAC sedation, administered by CRNA TOPICAL ANESTHETIC:  DESCRIPTION OF PROCEDURE:   After the risks benefits and alternatives of the procedure were thoroughly explained, informed consent was obtained.  The Pentax Ercp Scope [id number]  endoscope was introduced through the mouth  and advanced to the third portion of the duodenum .  There was an impacted stone in the ampulla. The common bile duct was cannulated with a 0.35 mm guidewire. Selective injection of the common bile duct suggested some filling defects in the distal duct although they were not clearly seen. A 12-15 mm sphincterotomy was done.  Several stones fell out from the sphincterotomized papilla.  The duct was swept with a 12 mm balloon stone extractor multiple times.  Stones and fragments were seen in the duodenum.  Final cholangiogram was normal. The scope was then completely withdrawn from the patient and the procedure terminated.     COMPLICATIONS:  ENDOSCOPIC IMPRESSION: Choledocholithiasis - status post sphincterotomy and stone extraction  RECOMMENDATIONS: advance diet to me: DC antibiotics in a.m. per surgery    _______________________________ eSigned:  Louis Meckelobert D Kaplan, MD 11/16/2013 5:48 PM   HY:QMVHQCC:Bruce Caryl NeverBurchette, MD, Axel FillerArmando Ramirez, MD

## 2013-11-16 NOTE — Discharge Instructions (Signed)
[facility] ______[facility] °LAPAROSCOPIC SURGERY: POST OP INSTRUCTIONS °Always review your discharge instruction sheet given to you by the facility where your surgery was performed. °IF YOU HAVE DISABILITY OR FAMILY LEAVE FORMS, YOU MUST BRING THEM TO THE OFFICE FOR PROCESSING.   °DO NOT GIVE THEM TO YOUR DOCTOR. ° °1. A prescription for pain medication may be given to you upon discharge.  Take your pain medication as prescribed, if needed.  If narcotic pain medicine is not needed, then you may take acetaminophen (Tylenol) or ibuprofen (Advil) as needed. °2. Take your usually prescribed medications unless otherwise directed. °3. If you need a refill on your pain medication, please contact your pharmacy.  They will contact our office to request authorization. Prescriptions will not be filled after 5pm or on week-ends. °4. You should follow a light diet the first few days after arrival home, such as soup and crackers, etc.  Be sure to include lots of fluids daily. °5. Most patients will experience some swelling and bruising in the area of the incisions.  Ice packs will help.  Swelling and bruising can take several days to resolve.  °6. It is common to experience some constipation if taking pain medication after surgery.  Increasing fluid intake and taking a stool softener (such as Colace) will usually help or prevent this problem from occurring.  A mild laxative (Milk of Magnesia or Miralax) should be taken according to package instructions if there are no bowel movements after 48 hours. °7. Unless discharge instructions indicate otherwise, you may remove your bandages 24-48 hours after surgery, and you may shower at that time.  You may have steri-strips (small skin tapes) in place directly over the incision.  These strips should be left on the skin for 7-10 days.  If your surgeon used skin glue on the incision, you may shower in 24 hours.  The glue will flake off over the next 2-3 weeks.  Any sutures or  staples will be removed at the office during your follow-up visit. °8. ACTIVITIES:  You may resume regular (light) daily activities beginning the next day--such as daily self-care, walking, climbing stairs--gradually increasing activities as tolerated.  You may have sexual intercourse when it is comfortable.  Refrain from any heavy lifting or straining until approved by your doctor. °a. You may drive when you are no longer taking prescription pain medication, you can comfortably wear a seatbelt, and you can safely maneuver your car and apply brakes. °b. RETURN TO WORK:  __________________________________________________________ °9. You should see your doctor in the office for a follow-up appointment approximately 2-3 weeks after your surgery.  Make sure that you call for this appointment within a day or two after you arrive home to insure a convenient appointment time. °10. OTHER INSTRUCTIONS: __________________________________________________________________________________________________________________________ __________________________________________________________________________________________________________________________ °WHEN TO CALL YOUR DOCTOR: °1. Fever over 101.0 °2. Inability to urinate °3. Continued bleeding from incision. °4. Increased pain, redness, or drainage from the incision. °5. Increasing abdominal pain ° °The clinic staff is available to answer your questions during regular business hours.  Please don’t hesitate to call and ask to speak to one of the nurses for clinical concerns.  If you have a medical emergency, go to the nearest emergency room or call 911.  A surgeon from [facility] is always on call at the hospital. °[address], [address], [place], [place]  [address] ? [address] [address], ,    [address] °[phone] ? [phone] ? [phone] [phone] °Web site:   [email or web address] °

## 2013-11-16 NOTE — Transfer of Care (Signed)
Immediate Anesthesia Transfer of Care Note  Patient: Margaret Brady  Procedure(s) Performed: Procedure(s): ENDOSCOPIC RETROGRADE CHOLANGIOPANCREATOGRAPHY (ERCP) (N/A)  Patient Location: PACU  Anesthesia Type:General  Level of Consciousness: awake, alert  and oriented  Airway & Oxygen Therapy: Patient Spontanous Breathing  Post-op Assessment: Report given to PACU RN and Post -op Vital signs reviewed and stable  Post vital signs: Reviewed and stable  Complications: No apparent anesthesia complications

## 2013-11-16 NOTE — Progress Notes (Signed)
ERCP demonstrated multiple distal bile duct stones and a single stone impacted in the ampulla.  Stones were removed following sphincterotomy.  Plan full liquid diet.  Can D/C antibiotics in a.m.

## 2013-11-16 NOTE — Transfer of Care (Signed)
Immediate Anesthesia Transfer of Care Note  Patient: Margaret Brady  Procedure(s) Performed: Procedure(s): LAPAROSCOPIC CHOLECYSTECTOMY WITH INTRAOPERATIVE CHOLANGIOGRAM (N/A)  Patient Location: PACU  Anesthesia Type:General  Level of Consciousness: awake, alert  and oriented  Airway & Oxygen Therapy: Patient Spontanous Breathing  Post-op Assessment: Report given to PACU RN and Post -op Vital signs reviewed and stable  Post vital signs: Reviewed and stable  Complications: No apparent anesthesia complications

## 2013-11-16 NOTE — Interval H&P Note (Signed)
History and Physical Interval Note: s/p cholecystectomy.  IOC demonstrates a distal CBD stone.  11/16/2013 4:43 PM  Margaret Brady  has presented today for surgery, with the diagnosis of stones in bile duct on IOC this AM.  The various methods of treatment have been discussed with the patient and family. After consideration of risks, benefits and other options for treatment, the patient has consented to  Procedure(s): ENDOSCOPIC RETROGRADE CHOLANGIOPANCREATOGRAPHY (ERCP) (N/A) as a surgical intervention .  The patient's history has been reviewed, patient examined, no change in status, stable for surgery.  I have reviewed the patient's chart and labs.  Questions were answered to the patient's satisfaction.  The risks, benefits, and possible complications of the procedure, including bleeding, perforation, surgery, and the 5-10% risk for pancreatitis, were explained to the patient.  Patient's questions were answered.    Melvia Heapsobert Kaplan

## 2013-11-16 NOTE — Anesthesia Procedure Notes (Signed)
Procedure Name: Intubation Date/Time: 11/16/2013 7:27 AM Performed by: Arlice ColtMANESS, Karalyn Kadel B Pre-anesthesia Checklist: Patient identified, Emergency Drugs available, Suction available, Patient being monitored and Timeout performed Patient Re-evaluated:Patient Re-evaluated prior to inductionOxygen Delivery Method: Circle system utilized Preoxygenation: Pre-oxygenation with 100% oxygen Intubation Type: IV induction Ventilation: Mask ventilation without difficulty Laryngoscope Size: Mac and 3 Grade View: Grade I Tube type: Oral Tube size: 7.0 mm Airway Equipment and Method: Stylet Placement Confirmation: ETT inserted through vocal cords under direct vision and positive ETCO2 Secured at: 21 cm Tube secured with: Tape Dental Injury: Teeth and Oropharynx as per pre-operative assessment

## 2013-11-16 NOTE — Op Note (Signed)
11/16/2013  8:38 AM  PATIENT:  Margaret Brady  48 y.o. female  PRE-OPERATIVE DIAGNOSIS:  gallstones  POST-OPERATIVE DIAGNOSIS:  gallstones  PROCEDURE:  Procedure(s): LAPAROSCOPIC CHOLECYSTECTOMY WITH INTRAOPERATIVE CHOLANGIOGRAM (N/A)  SURGEON:  Surgeon(s) and Role:    * Axel FillerArmando Srinivas Lippman, MD - Primary  PHYSICIAN ASSISTANT:   ASSISTANTS: none   ANESTHESIA:   local and general  EBL:  Total I/O In: 900 [I.V.:900] Out: -   BLOOD ADMINISTERED:none  DRAINS: none   LOCAL MEDICATIONS USED:  BUPIVICAINE   SPECIMEN:  Source of Specimen:  gallbladder  DISPOSITION OF SPECIMEN:  PATHOLOGY  COUNTS:  YES  TOURNIQUET:  * No tourniquets in log *  DICTATION: .Dragon Dictation  Details of the procedure:  The patient was taken to the operating and placed in the supine position with bilateral SCDs in place. A time out was called and all facts were verified. A pneumoperitoneum was obtained via A Veress needle technique to a pressure of 14mm of mercury. A 5mm trochar was then placed in the right upper quadrant under visualization, and there were no injuries to any abdominal organs. A 11 mm port was then placed in the umbilical region after infiltrating with local anesthesia under direct visualization. A second and third epigastric port and right lower quadrant port placement under direct visualization, respectively. Upon initially grasping the gallbaldder with a Prestige device, a foreign body dropped from the Prestige grasper.  It was removed as were small particles.  The Prestige device was handed off as was the [employer] Association[employer]olphin nosed grasper.  The gallbladder was identified and retracted, the peritoneum was then sharply dissected from the gallbladder and this dissection was carried down to Calot's triangle. The gallbladder was identified and stripped away circumferentially and seen going into the gallbladder 360, the critical angle was obtained. The cystic artery was identified and 2 clips placed  proximally and one distally and transected.A Cook catheter was used to perform an intraoperative cholangiogram. The biliary radicals as well as the cystic duct was seen.The common bile duct was seen to have a mensicus sign and did not empty into the duodenum.  2mg  of glucogon was injected and after two minutes the cystic duct was injected with sterile saline.  Again a cholangiogram was shot and the meniscus sign was again seen and contrast did not infiltrate the duodenum. This ended the cholangiogram.  Secondary to the the width of the cystic duct it was cut, and an Endoloop was placed securely around the cystic duct stump.   We then proceeded to remove the gallbladder off the hepatic fossa with Bovie cautery. A latex retrieval bag was then placed in the abdomen and gallbladder placed in the bag. The hepatic fossa was then reexamined and hemostasis was achieved with Bovie cautery and was excellent at the end of the case. The subhepatic fossa and perihepatic fossa was then irrigated until the effluent was clear. The 11 mm trocar fascia was reapproximated with the Endo Close #1 Vicryl.  The pneumoperitoneum was evacuated and all trochars removed under direct visulalization.  The skin was then closed with 4-0 Monocryl and the skin dressed with Steri-Strips, gauze, and tape.  The patient was awaken from general anesthesia and taken to the recovery room in stable condition.  Of note there was a midline defect inferior to the umbilicus where the rectus muscle could be seen.  That anterior fascia was seen.  There was no overt hernia that was seen, aside from the defect in the posterior sheath of  the rectus muscle.   PLAN OF CARE: Admit for overnight observation  PATIENT DISPOSITION:  PACU - hemodynamically stable.   Delay start of Pharmacological VTE agent (>24hrs) due to surgical blood loss or risk of bleeding: not applicable

## 2013-11-16 NOTE — Anesthesia Preprocedure Evaluation (Signed)
Anesthesia Evaluation  Patient identified by MRN, date of birth, ID band Patient awake    Reviewed: Allergy & Precautions, H&P , NPO status , reviewed documented beta blocker date and time   History of Anesthesia Complications (+) PONV and history of anesthetic complications  Airway Mallampati: II  Neck ROM: Full    Dental  (+) Teeth Intact   Pulmonary Current Smoker,  breath sounds clear to auscultation        Cardiovascular negative cardio ROS  Rhythm:Regular Rate:Normal     Neuro/Psych    GI/Hepatic GERD-  ,  Endo/Other  negative endocrine ROS  Renal/GU negative Renal ROS     Musculoskeletal  (+) Arthritis -,   Abdominal   Peds  Hematology negative hematology ROS (+)   Anesthesia Other Findings   Reproductive/Obstetrics                           Anesthesia Physical Anesthesia Plan  ASA: II  Anesthesia Plan: General   Post-op Pain Management:    Induction: Intravenous  Airway Management Planned: Oral ETT  Additional Equipment:   Intra-op Plan:   Post-operative Plan: Extubation in OR  Informed Consent: I have reviewed the patients History and Physical, chart, labs and discussed the procedure including the risks, benefits and alternatives for the proposed anesthesia with the patient or authorized representative who has indicated his/her understanding and acceptance.   Dental advisory given  Plan Discussed with: CRNA and Surgeon  Anesthesia Plan Comments:         Anesthesia Quick Evaluation

## 2013-11-16 NOTE — Anesthesia Postprocedure Evaluation (Signed)
Anesthesia Post Note  Patient: Margaret Brady  Procedure(s) Performed: Procedure(s) (LRB): LAPAROSCOPIC CHOLECYSTECTOMY WITH INTRAOPERATIVE CHOLANGIOGRAM (N/A)  Anesthesia type: general  Patient location: PACU  Post pain: Pain level controlled  Post assessment: Patient's Cardiovascular Status Stable  Last Vitals:  Filed Vitals:   11/16/13 0945  BP:   Pulse: 55  Temp:   Resp: 16    Post vital signs: Reviewed and stable  Level of consciousness: sedated  Complications: No apparent anesthesia complications

## 2013-11-16 NOTE — Consult Note (Signed)
[facility] Consult: 11:09 AM 11/16/2013  LOS: 0 days    Referring Provider: Dr Ramirex  Primary Care Physician:  Delorse Lek, MD at [facility] in [place] Primary Gastroenterologist:  Gentry Fitz.     Reason for Consultation:  Choledocholithiasis on IOC today.    HPI: Margaret Brady is a 48 y.o. female.  Generally healthy. ~11 days ago, on a Monday had acute upper abdominal pain, band like pattern around to back and scapula.  Subsided after a few hours.  Subsequent similar events always in PM and after eating fatty foods.  Last was after TIramisu (for her birthday) over this past weekend.  Nausea with pain but does not vomit. Referred to Dr Derrell Lolling for lap chole.   The ultrasound 3/12 shows 10.9 mm CBD and distal stone.  Slight intrahepatic duct dilation.   Multiple GB stones.  No labs in Epic from 3/12 but office note says normal T bili and elevated transaminases.   She is alert and still having abdominal discomfort after lap chole this AM.  IOC + for choledocholithiasis    Past Medical History  Diagnosis Date  . Complication of anesthesia     spinal with section n&v  . Depression     hx postpartum dep  . GERD (gastroesophageal reflux disease)     occ  . Headache(784.0)   . Arthritis     Past Surgical History  Procedure Laterality Date  . Cesarean section      2001.    Prior to Admission medications   Medication Sig Start Date End Date Taking? Authorizing Provider  acetaminophen (TYLENOL) 325 MG tablet Take 325-650 mg by mouth every 6 (six) hours as needed for mild pain.   Yes Historical Provider, MD  diphenhydramine-acetaminophen (TYLENOL PM) 25-500 MG TABS Take 1 tablet by mouth at bedtime as needed (sleep/pain).   Yes Historical Provider, MD  norethindrone (MICRONOR,CAMILA,ERRIN)  0.35 MG tablet Take 1 tablet by mouth daily.   Yes Historical Provider, MD  ibuprofen (ADVIL,MOTRIN) 200 MG tablet Take 200 mg by mouth every 6 (six) hours as needed.    Historical Provider, MD    Scheduled Meds: . HYDROmorphone      . HYDROmorphone      . pantoprazole (PROTONIX) IV  40 mg Intravenous QHS  . piperacillin-tazobactam (ZOSYN)  IV  3.375 g Intravenous 3 times per day  . promethazine       Infusions: . dextrose 5 % and 0.9% NaCl     PRN Meds: HYDROmorphone (DILAUDID) injection, ondansetron, oxyCODONE   Allergies as of 11/13/2013  . (No Known Allergies)    History reviewed. No pertinent family history.  History   Social History  . Marital Status: Significant Other    Spouse Name: N/A    Number of Children: N/A  . Years of Education: N/A   Occupational History  . 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    Social History Main Topics  . Smoking status: Current Some Day Smoker -- 0.25 packs/day for 2 years  Types: Cigarettes  . Smokeless tobacco: Never Used     Comment: quiting  down to 2 cig a day  . Alcohol Use: No  . Drug Use: No  . Sexual Activity: Not on file   Other Topics Concern  . Not on file   Social History Narrative  . No narrative on file    REVIEW OF SYSTEMS: Constitutional:  No weight loss or gain ENT:  No nose bleeds Pulm:  No cough or DOE CV:  No palpitations, no LE edema.  GU:  No hematuria, no frequency GI:  Per HPI Heme:  Many years ago.     Transfusions:  None ever Neuro:  No headaches, no peripheral tingling or numbness Derm:  No itching, no rash or sores.  Endocrine:  No sweats or chills.  No polyuria or dysuria Immunization:  Does not take flu shots.  Travel:  None beyond local counties in last few months.    PHYSICAL EXAM: Vital signs in last 24 hours: Filed Vitals:   11/16/13 0956  BP:   Pulse: 73  Temp:   Resp:    Wt Readings from Last 3 Encounters:  11/16/13 74.5 kg (164 lb  3.9 oz)  11/16/13 74.5 kg (164 lb 3.9 oz)  11/15/13 74.481 kg (164 lb 3.2 oz)   General: pleasant, alert, somewhat comfortable and not ill looking Head:  No asymmetry or swelling  Eyes:  No icterus or pallor Ears:  Not HOH  Nose:  No congestion or discharge Mouth:  Clear and moist.  Good teeth Neck:  No mass, no TMG Lungs:  Clear bil.  No dyspnea , cough, or hoarseness Heart: RRR.  No MRG Abdomen:  Soft, bandaged incisions.  Moderate tenderness on right.   Rectal: deferred   Musc/Skeltl: no joint swelling or deformity Extremities:  No CCE  Neurologic:  Oriented x 3.  Slightly drowsy.  No limb weakness.  No tremor Skin:  No rash, no sores.  No telangectasia Nodes:  No cervical adenopathy   Psych:  Pleasant, relaxed.   Intake/Output from previous day:   Intake/Output this shift: Total I/O In: 1050 [I.V.:1050] Out: -   LAB RESULTS:  Recent Labs  11/15/13 1110  WBC 7.0  HGB 15.8*  HCT 43.4  PLT 267   BMET Lab Results  Component Value Date   NA 140 11/15/2013   K 5.1 11/15/2013   CL 103 11/15/2013   CO2 24 11/15/2013   GLUCOSE 96 11/15/2013   BUN 12 11/15/2013   CREATININE 0.75 11/15/2013   CALCIUM 10.0 11/15/2013   LFT No results found for this basename: PROT, ALBUMIN, AST, ALT, ALKPHOS, BILITOT, BILIDIR, IBILI,  in the last 72 hours PT/INR No results found for this basename: INR, PROTIME   Hepatitis Panel No results found for this basename: HEPBSAG, HCVAB, HEPAIGM, HEPBIGM,  in the last 72 hours C-Diff No components found with this basename: cdiff   Lipase  No results found for this basename: lipase    Drugs of Abuse  No results found for this basename: labopia, cocainscrnur, labbenz, amphetmu, thcu, labbarb     RADIOLOGY STUDIES: Dg Cholangiogram Operative 11/16/2013   .  COMPARISON:  US ABDOMEN COMPLETE dated 11/09/2013  FINDINGS: Intraoperative cholangiogram demonstrates dilatation of the of the common bile duct and common hepatic duct. There is a filling  defect within the distal common bile duct. This corresponds to the echogenic common bile duct stone seen on comparison ultrasound. Gallstones are noted within the gallbladder.  IMPRESSION:  Obstruction of distal common bile duct corresponds to echogenic focus seen on comparison ultrasound.  This was made a call report.   Electronically Signed   By: Genevive Bi M.D.   On: 11/16/2013 08:44   Abdominal ultrasound 11/09/13 FINDINGS:  Gallbladder: The gallbladder is visualized and multiple gallstones are noted  within the gallbladder with shadowing. The majority of these  gallstones measure no more than 7 mm in diameter. There is a  gallstone which appears to be within the neck of the gallbladder and  does not move. There is no pain currently over the gallbladder to indicate acute cholecystitis.  Common bile duct: Diameter: The common bile duct is dilated measuring 10.9 mm. Within  the common bile duct there is an oval echogenic focus of 1.5 cm most consistent with a common bile duct calculus.  Liver: The liver has a normal echogenic pattern. There is very slight  dilatation of the central intrahepatic ducts.  IVC: No abnormality visualized.  Pancreas: The pancreatic duct measures 2.2 mm. No abnormality of the pancreas is seen.  Spleen: The spleen is normal measuring 10.5 cm.  Right Kidney: Length: 11.6 cm. No hydronephrosis is seen.  Left Kidney: Length: 10.5 cm. No hydronephrosis is noted.  Abdominal aorta: The abdominal aorta is normal caliber.  Other findings: None.  IMPRESSION:  1. The does appear to be in echodensity within the common bile duct  with dilatation of the common bile duct common most consistent with  common bile duct calculus, with slight intrahepatic ductal dilatation as well.  2. Multiple gallstones within the gallbladder 1 of which are remains  within the neck of the gallbladder. No pain is present over the gallbladder currently.     ENDOSCOPIC STUDIES: None ever.     IMPRESSION:   *  S/p lap chole with positive IOC.     PLAN:     *  ERCP Discussed with pt and her fiance Marcheta Grammes [phone].  They are agreeable to proceed.    Jennye Moccasin  11/16/2013, 11:09 AM Pager: [phone]  GI Attending Note   Chart was reviewed and patient was examined. X-rays and lab were reviewed.    I agree with management and plans.  IOC  demonstrates a distal bile duct stone.  Plan ERCP today. The risks, benefits, and possible complications of the procedure, including bleeding, perforation, surgery, and the 5-10% risk for pancreatitis, were explained to the patient.  Patient's questions were answered.   Barbette Hair. Arlyce Dice, M.D., [facility] Cell [phone]

## 2013-11-16 NOTE — Interval H&P Note (Signed)
History and Physical Interval Note:  11/16/2013 6:49 AM  Shirlee LatchAnna M Turski  has presented today for surgery, with the diagnosis of gallstones  The various methods of treatment have been discussed with the patient and family. After consideration of risks, benefits and other options for treatment, the patient has consented to  Procedure(s): LAPAROSCOPIC CHOLECYSTECTOMY WITH INTRAOPERATIVE CHOLANGIOGRAM (N/A) as a surgical intervention .  The patient's history has been reviewed, patient examined, no change in status, stable for surgery.  I have reviewed the patient's chart and labs.  Questions were answered to the patient's satisfaction.     Marigene Ehlersamirez Jr., Jed LimerickArmando

## 2013-11-16 NOTE — Addendum Note (Signed)
Addendum created 11/16/13 1006 by Sheppard Evensarrie B Crayton Savarese, CRNA   Modules edited: Anesthesia Medication Administration

## 2013-11-17 ENCOUNTER — Encounter (HOSPITAL_COMMUNITY): Payer: Self-pay | Admitting: Gastroenterology

## 2013-11-17 LAB — COMPREHENSIVE METABOLIC PANEL
ALT: 90 U/L — ABNORMAL HIGH (ref 0–35)
AST: 71 U/L — ABNORMAL HIGH (ref 0–37)
Albumin: 3.2 g/dL — ABNORMAL LOW (ref 3.5–5.2)
Alkaline Phosphatase: 99 U/L (ref 39–117)
BUN: 7 mg/dL (ref 6–23)
CALCIUM: 8.8 mg/dL (ref 8.4–10.5)
CO2: 23 meq/L (ref 19–32)
Chloride: 105 mEq/L (ref 96–112)
Creatinine, Ser: 0.77 mg/dL (ref 0.50–1.10)
GFR calc non Af Amer: >90 mL/min (ref >90)
GLUCOSE: 93 mg/dL (ref 70–99)
Potassium: 3.8 mEq/L (ref 3.7–5.3)
Sodium: 139 mEq/L (ref 137–147)
TOTAL PROTEIN: 5.7 g/dL — AB (ref 6.0–8.3)
Total Bilirubin: 0.6 mg/dL (ref 0.3–1.2)

## 2013-11-17 MED ORDER — OXYCODONE HCL 5 MG PO TABS: 5.0000 mg | ORAL_TABLET | ORAL | Status: DC | PRN

## 2013-11-17 NOTE — Progress Notes (Signed)
1 Day Post-Op  Subjective: Doing s/p lap chole and ERCP.  Pt pain controlled well and tol PO  Objective: Vital signs in last 24 hours: Temp:  [97.5 F (36.4 C)-99.1 F (37.3 C)] 98.3 F (36.8 C) (03/20 0631) Pulse Rate:  [54-86] 73 (03/20 0631) Resp:  [11-18] 16 (03/20 0631) BP: (112-155)/(66-93) 121/71 mmHg (03/20 0631) SpO2:  [91 %-100 %] 98 % (03/20 0631) Last BM Date: 11/15/13  Intake/Output from previous day: 03/19 0701 - 03/20 0700 In: 2490 [I.V.:2390; IV Piggyback:100] Out: -  Intake/Output this shift: Total I/O In: 1050 [I.V.:1000; IV Piggyback:50] Out: -   General appearance: alert and cooperative Cardio: regular rate and rhythm, S1, S2 normal, no murmur, click, rub or gallop GI: soft, non-tender; bowel sounds normal; no masses,  no organomegaly  Lab Results:   Recent Labs  11/15/13 1110  WBC 7.0  HGB 15.8*  HCT 43.4  PLT 267   BMET  Recent Labs  11/15/13 1110 11/17/13 0335  NA 140 139  K 5.1 3.8  CL 103 105  CO2 24 23  GLUCOSE 96 93  BUN 12 7  CREATININE 0.75 0.77  CALCIUM 10.0 8.8    Studies/Results: Dg Cholangiogram Operative  11/16/2013   CLINICAL DATA:  Intraoperative cholangiogram. Laparoscopic cholecystectomy.  EXAM: INTRAOPERATIVE CHOLANGIOGRAM  TECHNIQUE: Cholangiographic images from the C-arm fluoroscopic device were submitted for interpretation post-operatively. Please see the procedural report for the amount of contrast and the fluoroscopy time utilized.  COMPARISON:  US ABDOMEN COMPLETE dated 11/09/2013  FINDINGS: Intraoperative cholangiogram demonstrates dilatation of the of the common bile duct and common hepatic duct. There is a filling defect within the distal common bile duct. This corresponds to the echogenic common bile duct stone seen on comparison ultrasound. Gallstones are noted within the gallbladder.  IMPRESSION: Obstruction of distal common bile duct corresponds to echogenic focus seen on comparison ultrasound.  This was made  a call report.   Electronically Signed   By: Genevive BiStewart  Edmunds M.D.   On: 11/16/2013 08:44   Dg Ercp Biliary & Pancreatic Ducts  11/16/2013   CLINICAL DATA:  Common bile duct stones.  EXAM: ERCP  TECHNIQUE: Multiple spot images obtained with the fluoroscopic device and submitted for interpretation post-procedure.  COMPARISON:  Intraoperative cholangiography from the same day.  FINDINGS: Intraprocedural fluoroscopic images of the right upper quadrant show endoscope with cannulation of the common bile duct. Balloon sweep was performed with free flow of contrast into the duodenum.  IMPRESSION: ERCP with balloon sweep.  These images were submitted for radiologic interpretation only. Please see the procedural report for the amount of contrast and the fluoroscopy time utilized.   Electronically Signed   By: Tiburcio PeaJonathan  Watts M.D.   On: 11/16/2013 18:08    Anti-infectives: Anti-infectives   Start     Dose/Rate Route Frequency Ordered Stop   11/16/13 1400  piperacillin-tazobactam (ZOSYN) IVPB 3.375 g     3.375 g 12.5 mL/hr over 240 Minutes Intravenous 3 times per day 11/16/13 1019     11/16/13 0600  ceFAZolin (ANCEF) IVPB 2 g/50 mL premix     2 g 100 mL/hr over 30 Minutes Intravenous On call to O.R. 11/15/13 1236 11/16/13 0730      Assessment/Plan: s/p Procedure(s): ENDOSCOPIC RETROGRADE CHOLANGIOPANCREATOGRAPHY (ERCP) (N/A) Advance diet Home later today after tol reg PO  LOS: 1 day    Margaret Ehlersamirez Jr., Margaret Brady 11/17/2013

## 2013-11-17 NOTE — Progress Notes (Signed)
UR completed.  Mikah Poss, RN BSN [facility] CCM Trauma/Neuro ICU Case Manager [phone]  

## 2013-11-17 NOTE — Anesthesia Postprocedure Evaluation (Signed)
  Anesthesia Post-op Note  Patient: Margaret Brady  Procedure(s) Performed: Procedure(s): ENDOSCOPIC RETROGRADE CHOLANGIOPANCREATOGRAPHY (ERCP) (N/A)  Patient Location: PACU  Anesthesia Type:General  Level of Consciousness: awake and alert   Airway and Oxygen Therapy: Patient Spontanous Breathing  Post-op Pain: mild  Post-op Assessment: Post-op Vital signs reviewed, Patient's Cardiovascular Status Stable, Respiratory Function Stable, Patent Airway, No signs of Nausea or vomiting and Pain level controlled  Post-op Vital Signs: Reviewed and stable  Complications: No apparent anesthesia complications

## 2013-11-17 NOTE — Discharge Summary (Signed)
Physician Discharge Summary  Patient ID: Margaret Brady Forand MRN: [id number] DOB/AGE: Oct 01, 1965 48 y.o.  Admit date: 11/16/2013 Discharge date: 11/17/2013  Admission Diagnoses: choledocholithiasis  Discharge Diagnoses:  Active Problems:   S/P laparoscopic cholecystectomy   Calculus of bile duct without mention of cholecystitis or obstruction s/p ERCP  Discharged Condition: good  Hospital Course: PT was admitted s/p Lap chole sercondary to CBD stones.  GI was consulted and Dr. Arlyce DiceKaplan took pt for ERCP to clear her stones.  PT tol proc well.  She was ambulating on her own and tol PO well.  She was deemed stable for DC and Dc'd home.  Consults: GI  Significant Diagnostic Studies: ERCP  Treatments: antibiotics: Zosyn  Discharge Exam: Blood pressure 121/71, pulse 73, temperature 98.3 F (36.8 C), temperature source Oral, resp. rate 16, height 5\' 4"  (1.626 Brady), weight 164 lb 3.9 oz (74.5 kg), SpO2 98.00%. General appearance: alert and cooperative Cardio: regular rate and rhythm, S1, S2 normal, no murmur, click, rub or gallop GI: soft, non-tender; bowel sounds normal; no masses,  no organomegaly  Disposition:   Discharge Orders   Future Appointments Provider Department Dept Phone   12/07/2013 11:40 AM Axel FillerArmando Vallie Teters, MD [facility] [place] [phone]   Future Orders Complete By Expires   Discharge patient  As directed        Medication List         acetaminophen 325 MG tablet  Commonly known as:  TYLENOL  Take 325-650 mg by mouth every 6 (six) hours as needed for mild pain.     diphenhydramine-acetaminophen 25-500 MG Tabs  Commonly known as:  TYLENOL PM  Take 1 tablet by mouth at bedtime as needed (sleep/pain).     ibuprofen 200 MG tablet  Commonly known as:  ADVIL,MOTRIN  Take 200 mg by mouth every 6 (six) hours as needed.     norethindrone 0.35 MG tablet  Commonly known as:  MICRONOR,CAMILA,ERRIN  Take 1 tablet by mouth daily.     oxyCODONE 5 MG immediate  release tablet  Commonly known as:  Oxy IR/ROXICODONE  Take 1 tablet (5 mg total) by mouth every 4 (four) hours as needed for moderate pain.           Follow-up Information   Follow up with Lajean Saveramirez Jr., Tnia Anglada, MD. Schedule an appointment as soon as possible for a visit in 2 weeks.   Specialty:  General Surgery   Contact information:   [address] [place] [address] [phone]       Signed: Marigene EhlersRamirez Jr., Jed LimerickArmando 11/17/2013, 7:02 AM

## 2013-12-07 ENCOUNTER — Encounter (INDEPENDENT_AMBULATORY_CARE_PROVIDER_SITE_OTHER): Payer: Self-pay | Admitting: General Surgery

## 2013-12-12 ENCOUNTER — Encounter (INDEPENDENT_AMBULATORY_CARE_PROVIDER_SITE_OTHER): Payer: Self-pay | Admitting: General Surgery

## 2013-12-19 ENCOUNTER — Encounter (INDEPENDENT_AMBULATORY_CARE_PROVIDER_SITE_OTHER): Payer: Self-pay

## 2014-12-19 ENCOUNTER — Telehealth: Payer: Self-pay | Admitting: *Deleted

## 2014-12-19 ENCOUNTER — Encounter: Payer: Self-pay | Admitting: Women's Health

## 2014-12-19 ENCOUNTER — Other Ambulatory Visit (HOSPITAL_COMMUNITY)
Admission: RE | Admit: 2014-12-19 | Discharge: 2014-12-19 | Disposition: A | Discharge status: Home / Self Care | Payer: 59 | Source: Ambulatory Visit | Attending: Gynecology | Admitting: Gynecology

## 2014-12-19 ENCOUNTER — Ambulatory Visit (INDEPENDENT_AMBULATORY_CARE_PROVIDER_SITE_OTHER): Payer: 59 | Admitting: Women's Health

## 2014-12-19 VITALS — BP 120/80 | Ht 64.0 in | Wt 167.0 lb

## 2014-12-19 DIAGNOSIS — Z01419 Encounter for gynecological examination (general) (routine) without abnormal findings: Secondary | ICD-10-CM

## 2014-12-19 DIAGNOSIS — Z13220 Encounter for screening for lipoid disorders: Secondary | ICD-10-CM

## 2014-12-19 DIAGNOSIS — Z780 Asymptomatic menopausal state: Secondary | ICD-10-CM | POA: Diagnosis not present

## 2014-12-19 LAB — CBC WITH DIFFERENTIAL/PLATELET
Basophils Absolute: 0.1 10*3/uL (ref 0.0–0.1)
Basophils Relative: 1 % (ref 0–1)
EOS ABS: 0.3 10*3/uL (ref 0.0–0.7)
Eosinophils Relative: 4 % (ref 0–5)
HEMATOCRIT: 42.5 % (ref 36.0–46.0)
HEMOGLOBIN: 15.1 g/dL — AB (ref 12.0–15.0)
LYMPHS ABS: 2.6 10*3/uL (ref 0.7–4.0)
Lymphocytes Relative: 40 % (ref 12–46)
MCH: 34.8 pg — ABNORMAL HIGH (ref 26.0–34.0)
MCHC: 35.5 g/dL (ref 30.0–36.0)
MCV: 97.9 fL (ref 78.0–100.0)
MPV: 10.7 fL (ref 8.6–12.4)
Monocytes Absolute: 0.5 10*3/uL (ref 0.1–1.0)
Monocytes Relative: 7 % (ref 3–12)
NEUTROS ABS: 3.1 10*3/uL (ref 1.7–7.7)
NEUTROS PCT: 48 % (ref 43–77)
Platelets: 238 10*3/uL (ref 150–400)
RBC: 4.34 MIL/uL (ref 3.87–5.11)
RDW: 12.4 % (ref 11.5–15.5)
WBC: 6.5 10*3/uL (ref 4.0–10.5)

## 2014-12-19 MED ORDER — PROGESTERONE MICRONIZED 200 MG PO CAPS: ORAL_CAPSULE | ORAL | Status: DC

## 2014-12-19 MED ORDER — ESTRADIOL 0.06 MG/24HR TD PTWK: 1.0000 | MEDICATED_PATCH | TRANSDERMAL | Status: DC

## 2014-12-19 NOTE — Patient Instructions (Signed)
Hormone Therapy At menopause, your body begins making less estrogen and progesterone hormones. This causes the body to stop having menstrual periods. This is because estrogen and progesterone hormones control your periods and menstrual cycle. A lack of estrogen may cause symptoms such as:  Hot flushes (or hot flashes).  Vaginal dryness.  Dry skin.  Loss of sex drive.  Risk of bone loss (osteoporosis). When this happens, you may choose to take hormone therapy to get back the estrogen lost during menopause. When the hormone estrogen is given alone, it is usually referred to as ET (Estrogen Therapy). When the hormone progestin is combined with estrogen, it is generally called HT (Hormone Therapy). This was formerly known as hormone replacement therapy (HRT). Your caregiver can help you make a decision on what will be best for you. The decision to use HT seems to change often as new studies are done. Many studies do not agree on the benefits of hormone replacement therapy. LIKELY BENEFITS OF HT INCLUDE PROTECTION FROM:  Hot Flushes (also called hot flashes) - A hot flush is a sudden feeling of heat that spreads over the face and body. The skin may redden like a blush. It is connected with sweats and sleep disturbance. Women going through menopause may have hot flushes a few times a month or several times per day depending on the woman.  Osteoporosis (bone loss)- Estrogen helps guard against bone loss. After menopause, a woman's bones slowly lose calcium and become weak and brittle. As a result, bones are more likely to break. The hip, wrist, and spine are affected most often. Hormone therapy can help slow bone loss after menopause. Weight bearing exercise and taking calcium with vitamin D also can help prevent bone loss. There are also medications that your caregiver can prescribe that can help prevent osteoporosis.  Vaginal Dryness - Loss of estrogen causes changes in the vagina. Its lining may  become thin and dry. These changes can cause pain and bleeding during sexual intercourse. Dryness can also lead to infections. This can cause burning and itching. (Vaginal estrogen treatment can help relieve pain, itching, and dryness.)  Urinary Tract Infections are more common after menopause because of lack of estrogen. Some women also develop urinary incontinence because of low estrogen levels in the vagina and bladder.  Possible other benefits of estrogen include a positive effect on mood and short-term memory in women. RISKS AND COMPLICATIONS  Using estrogen alone without progesterone causes the lining of the uterus to grow. This increases the risk of lining of the uterus (endometrial) cancer. Your caregiver should give another hormone called progestin if you have a uterus.  Women who take combined (estrogen and progestin) HT appear to have an increased risk of breast cancer. The risk appears to be small, but increases throughout the time that HT is taken.  Combined therapy also makes the breast tissue slightly denser which makes it harder to read mammograms (breast X-rays).  Combined, estrogen and progesterone therapy can be taken together every day, in which case there may be spotting of blood. HT therapy can be taken cyclically in which case you will have menstrual periods. Cyclically means HT is taken for a set amount of days, then not taken, then this process is repeated.  HT may increase the risk of stroke, heart attack, breast cancer and forming blood clots in your leg.  Transdermal estrogen (estrogen that is absorbed through the skin with a patch or a cream) may have more positive results with:    Cholesterol.  Blood pressure.  Blood clots. Having the following conditions may indicate you should not have HT:  Endometrial cancer.  Liver disease.  Breast cancer.  Heart disease.  History of blood clots.  Stroke. TREATMENT   If you choose to take HT and have a uterus,  usually estrogen and progestin are prescribed.  Your caregiver will help you decide the best way to take the medications.  Possible ways to take estrogen include:  Pills.  Patches.  Gels.  Sprays.  Vaginal estrogen cream, rings and tablets.  It is best to take the lowest dose possible that will help your symptoms and take them for the shortest period of time that you can.  Hormone therapy can help relieve some of the problems (symptoms) that affect women at menopause. Before making a decision about HT, talk to your caregiver about what is best for you. Be well informed and comfortable with your decisions. HOME CARE INSTRUCTIONS   Follow your caregivers advice when taking the medications.  A Pap test is done to screen for cervical cancer.  The first Pap test should be done at age 21.  Between ages 21 and 29, Pap tests are repeated every 2 years.  Beginning at age 30, you are advised to have a Pap test every 3 years as long as your past 3 Pap tests have been normal.  Some women have medical problems that increase the chance of getting cervical cancer. Talk to your caregiver about these problems. It is especially important to talk to your caregiver if a new problem develops soon after your last Pap test. In these cases, your caregiver may recommend more frequent screening and Pap tests.  The above recommendations are the same for women who have or have not gotten the vaccine for HPV (Human Papillomavirus).  If you had a hysterectomy for a problem that was not a cancer or a condition that could lead to cancer, then you no longer need Pap tests. However, even if you no longer need a Pap test, a regular exam is a good idea to make sure no other problems are starting.   If you are between ages 65 and 70, and you have had normal Pap tests going back 10 years, you no longer need Pap tests. However, even if you no longer need a Pap test, a regular exam is a good idea to make sure no  other problems are starting.   If you have had past treatment for cervical cancer or a condition that could lead to cancer, you need Pap tests and screening for cancer for at least 20 years after your treatment.  If Pap tests have been discontinued, risk factors (such as a new sexual partner) need to be re-assessed to determine if screening should be resumed.  Some women may need screenings more often if they are at high risk for cervical cancer.  Get mammograms done as per the advice of your caregiver. SEEK IMMEDIATE MEDICAL CARE IF:  You develop abnormal vaginal bleeding.  You have pain or swelling in your legs, shortness of breath, or chest pain.  You develop dizziness or headaches.  You have lumps or changes in your breasts or armpits.  You have slurred speech.  You develop weakness or numbness of your arms or legs.  You have pain, burning, or bleeding when urinating.  You develop abdominal pain. Document Released: 05/16/2003 Document Revised: 11/09/2011 Document Reviewed: 09/03/2010 [employer] Patient Information 2015 [employer]. This information is not intended to   replace advice given to you by your health care provider. Make sure you discuss any questions you have with your health care provider. Health Recommendations for Postmenopausal Women Respected and ongoing research has looked at the most common causes of death, disability, and poor quality of life in postmenopausal women. The causes include heart disease, diseases of blood vessels, diabetes, depression, cancer, and bone loss (osteoporosis). Many things can be done to help lower the chances of developing these and other common problems. CARDIOVASCULAR DISEASE Heart Disease: A heart attack is a medical emergency. Know the signs and symptoms of a heart attack. Below are things women can do to reduce their risk for heart disease.   Do not smoke. If you smoke, quit.  Aim for a healthy weight. Being overweight  causes many preventable deaths. Eat a healthy and balanced diet and drink an adequate amount of liquids.  Get moving. Make a commitment to be more physically active. Aim for 30 minutes of activity on most, if not all days of the week.  Eat for heart health. Choose a diet that is low in saturated fat and cholesterol and eliminate trans fat. Include whole grains, vegetables, and fruits. Read and understand the labels on food containers before buying.  Know your numbers. Ask your caregiver to check your blood pressure, cholesterol (total, HDL, LDL, triglycerides) and blood glucose. Work with your caregiver on improving your entire clinical picture.  High blood pressure. Limit or stop your table salt intake (try salt substitute and food seasonings). Avoid salty foods and drinks. Read labels on food containers before buying. Eating well and exercising can help control high blood pressure. STROKE  Stroke is a medical emergency. Stroke may be the result of a blood clot in a blood vessel in the brain or by a brain hemorrhage (bleeding). Know the signs and symptoms of a stroke. To lower the risk of developing a stroke:  Avoid fatty foods.  Quit smoking.  Control your diabetes, blood pressure, and irregular heart rate. THROMBOPHLEBITIS (BLOOD CLOT) OF THE LEG  Becoming overweight and leading a stationary lifestyle may also contribute to developing blood clots. Controlling your diet and exercising will help lower the risk of developing blood clots. CANCER SCREENING  Breast Cancer: Take steps to reduce your risk of breast cancer.  You should practice "breast self-awareness." This means understanding the normal appearance and feel of your breasts and should include breast self-examination. Any changes detected, no matter how small, should be reported to your caregiver.  After age 50, you should have a clinical breast exam (CBE) every year.  Starting at age 50, you should consider having a mammogram  (breast X-ray) every year.  If you have a family history of breast cancer, talk to your caregiver about genetic screening.  If you are at high risk for breast cancer, talk to your caregiver about having an MRI and a mammogram every year.  Intestinal or Stomach Cancer: Tests to consider are a rectal exam, fecal occult blood, sigmoidoscopy, and colonoscopy. Women who are high risk may need to be screened at an earlier age and more often.  Cervical Cancer:  Beginning at age 330, you should have a Pap test every 3 years as long as the past 3 Pap tests have been normal.  If you have had past treatment for cervical cancer or a condition that could lead to cancer, you need Pap tests and screening for cancer for at least 20 years after your treatment.  If you had a  hysterectomy for a problem that was not cancer or a condition that could lead to cancer, then you no longer need Pap tests.  If you are between ages 90 and 14, and you have had normal Pap tests going back 10 years, you no longer need Pap tests.  If Pap tests have been discontinued, risk factors (such as a new sexual partner) need to be reassessed to determine if screening should be resumed.  Some medical problems can increase the chance of getting cervical cancer. In these cases, your caregiver may recommend more frequent screening and Pap tests.  Uterine Cancer: If you have vaginal bleeding after reaching menopause, you should notify your caregiver.  Ovarian Cancer: Other than yearly pelvic exams, there are no reliable tests available to screen for ovarian cancer at this time except for yearly pelvic exams.  Lung Cancer: Yearly chest X-rays can detect lung cancer and should be done on high risk women, such as cigarette smokers and women with chronic lung disease (emphysema).  Skin Cancer: A complete body skin exam should be done at your yearly examination. Avoid overexposure to the sun and ultraviolet light lamps. Use a strong sun  block cream when in the sun. All of these things are important for lowering the risk of skin cancer. MENOPAUSE Menopause Symptoms: Hormone therapy products are effective for treating symptoms associated with menopause:  Moderate to severe hot flashes.  Night sweats.  Mood swings.  Headaches.  Tiredness.  Loss of sex drive.  Insomnia.  Other symptoms. Hormone replacement carries certain risks, especially in older women. Women who use or are thinking about using estrogen or estrogen with progestin treatments should discuss that with their caregiver. Your caregiver will help you understand the benefits and risks. The ideal dose of hormone replacement therapy is not known. The Food and Drug Administration (FDA) has concluded that hormone therapy should be used only at the lowest doses and for the shortest amount of time to reach treatment goals.  OSTEOPOROSIS Protecting Against Bone Loss and Preventing Fracture If you use hormone therapy for prevention of bone loss (osteoporosis), the risks for bone loss must outweigh the risk of the therapy. Ask your caregiver about other medications known to be safe and effective for preventing bone loss and fractures. To guard against bone loss or fractures, the following is recommended:  If you are younger than age 65, take 1000 mg of calcium and at least 600 mg of Vitamin D per day.  If you are older than age 33 but younger than age 39, take 1200 mg of calcium and at least 600 mg of Vitamin D per day.  If you are older than age 44, take 1200 mg of calcium and at least 800 mg of Vitamin D per day. Smoking and excessive alcohol intake increases the risk of osteoporosis. Eat foods rich in calcium and vitamin D and do weight bearing exercises several times a week as your caregiver suggests. DIABETES Diabetes Mellitus: If you have type I or type 2 diabetes, you should keep your blood sugar under control with diet, exercise, and recommended medication.  Avoid starchy and fatty foods, and too many sweets. Being overweight can make diabetes control more difficult. COGNITION AND MEMORY Cognition and Memory: Menopausal hormone therapy is not recommended for the prevention of cognitive disorders such as Alzheimer's disease or memory loss.  DEPRESSION  Depression may occur at any age, but it is common in elderly women. This may be because of physical, medical, social (loneliness), or  financial problems and needs. If you are experiencing depression because of medical problems and control of symptoms, talk to your caregiver about this. Physical activity and exercise may help with mood and sleep. Community and volunteer involvement may improve your sense of value and worth. If you have depression and you feel that the problem is getting worse or becoming severe, talk to your caregiver about which treatment options are best for you. ACCIDENTS  Accidents are common and can be serious in elderly woman. Prepare your house to prevent accidents. Eliminate throw rugs, place hand bars in bath, shower, and toilet areas. Avoid wearing high heeled shoes or walking on wet, snowy, and icy areas. Limit or stop driving if you have vision or hearing problems, or if you feel you are unsteady with your movements and reflexes. HEPATITIS C Hepatitis C is a type of viral infection affecting the liver. It is spread mainly through contact with blood from an infected person. It can be treated, but if left untreated, it can lead to severe liver damage over the years. Many people who are infected do not know that the virus is in their blood. If you are a "baby-boomer", it is recommended that you have one screening test for Hepatitis C. IMMUNIZATIONS  Several immunizations are important to consider having during your senior years, including:   Tetanus, diphtheria, and pertussis booster shot.  Influenza every year before the flu season begins.  Pneumonia vaccine.  Shingles  vaccine.  Others, as indicated based on your specific needs. Talk to your caregiver about these. Document Released: 10/09/2005 Document Revised: 01/01/2014 Document Reviewed: 06/04/2008 [facility] Patient Information 2015 [place], [place]. This information is not intended to replace advice given to you by your health care provider. Make sure you discuss any questions you have with your health care provider.

## 2014-12-19 NOTE — Telephone Encounter (Signed)
No, I guess HD gave her micronor.

## 2014-12-19 NOTE — Progress Notes (Signed)
Margaret Brady 12-Dec-1965 [id number]    History:    Presents for annual exam.  Irregular cycles for the past several years, increase in hot flushes, poor sleep, more emotional with tearfulness, hair changes, vaginal dryness. Last cycle greater than 6 months ago. Cholecystectomy 10/2013. Normal Pap history, has not been seen in our office for several years but has had [facility]. Normal mammogram history , overdue. Same partner greater than 9 years.  Past medical history, past surgical history, family history and social history were all reviewed and documented in the EPIC chart. Self-employed. Margaret Brady 14 doing well. Stepdaughter Margaret Brady numerous mental health problems currently living in a group home.  ROS:  A ROS was performed and pertinent positives and negatives are included.  Exam:  Filed Vitals:   12/19/14 1114  BP: 120/80    General appearance:  Normal Thyroid:  Symmetrical, normal in size, without palpable masses or nodularity. Respiratory  Auscultation:  Clear without wheezing or rhonchi Cardiovascular  Auscultation:  Regular rate, without rubs, murmurs or gallops  Edema/varicosities:  Not grossly evident Abdominal  Soft,nontender, without masses, guarding or rebound.  Liver/spleen:  No organomegaly noted  Hernia:  None appreciated  Skin  Inspection:  Grossly normal   Breasts: Examined lying and sitting.     Right: Without masses, retractions, discharge or axillary adenopathy.     Left: Without masses, retractions, discharge or axillary adenopathy. Gentitourinary   Inguinal/mons:  Normal without inguinal adenopathy  External genitalia:  Normal  BUS/Urethra/Skene's glands:  Normal  Vagina:  Normal  Cervix:  Normal  Uterus:   normal in size, shape and contour.  Midline and mobile  Adnexa/parametria:     Rt: Without masses or tenderness.   Lt: Without masses or tenderness.  Anus and perineum: Normal  Digital rectal exam: Normal sphincter tone  without palpated masses or tenderness  Assessment/Plan:  49 y.o. DWF G1P1 for annual exam.   Numerous  menopausal symptoms Family situational stress Health maintenance Smoker less than 5 cigarettes daily-process of quitting  Plan: SBE's, reviewed importance of annual screening mammogram, breast center information given, instructed to schedule. Continue active lifestyle, regular exercise, calcium rich diet, vitamin D 1000 daily encouraged. CBC, comprehensive metabolic panel, FSH, lipid panel, UA, vitamin D, Pap with HR HPV typing, new screening guidelines reviewed. HRT reviewed, slight risk for blood clots and strokes and breast cancer reviewed. Will try estradiol 0.06 patch weekly, Prometrium 200 at bedtime day 1 through 12 of each month, prescriptions for both given. Instructed to call if no relief of symptoms. Screening colonoscopy reviewed, [facility] information given. Counseling encouraged, [facility] name and number given.   Harrington ChallengerYOUNG,NANCY J WHNP, 1:02 PM 12/19/2014

## 2014-12-19 NOTE — Telephone Encounter (Signed)
Pt informed

## 2014-12-19 NOTE — Telephone Encounter (Signed)
Pt was prescribed estradiol 0.06 patch weekly, Prometrium 200 mg at bedtime, asked if you want her to continue her birth control pills as well? Please advise

## 2014-12-20 LAB — URINALYSIS W MICROSCOPIC + REFLEX CULTURE
Bacteria, UA: NONE SEEN
Bilirubin Urine: NEGATIVE
Casts: NONE SEEN
Crystals: NONE SEEN
GLUCOSE, UA: NEGATIVE mg/dL
Hgb urine dipstick: NEGATIVE
KETONES UR: NEGATIVE mg/dL
LEUKOCYTES UA: NEGATIVE
Nitrite: NEGATIVE
PH: 6 (ref 5.0–8.0)
Protein, ur: NEGATIVE mg/dL
Specific Gravity, Urine: 1.008 (ref 1.005–1.030)
Squamous Epithelial / LPF: NONE SEEN
Urobilinogen, UA: 0.2 mg/dL (ref 0.0–1.0)

## 2014-12-20 LAB — COMPREHENSIVE METABOLIC PANEL
ALBUMIN: 4.5 g/dL (ref 3.5–5.2)
ALT: 29 U/L (ref 0–35)
AST: 25 U/L (ref 0–37)
Alkaline Phosphatase: 52 U/L (ref 39–117)
BUN: 10 mg/dL (ref 6–23)
CALCIUM: 10.2 mg/dL (ref 8.4–10.5)
CO2: 25 mEq/L (ref 19–32)
CREATININE: 0.75 mg/dL (ref 0.50–1.10)
Chloride: 107 mEq/L (ref 96–112)
Glucose, Bld: 85 mg/dL (ref 70–99)
Potassium: 4.4 mEq/L (ref 3.5–5.3)
Sodium: 144 mEq/L (ref 135–145)
TOTAL PROTEIN: 6.9 g/dL (ref 6.0–8.3)
Total Bilirubin: 0.7 mg/dL (ref 0.2–1.2)

## 2014-12-20 LAB — LIPID PANEL
CHOL/HDL RATIO: 2.6 ratio
Cholesterol: 129 mg/dL (ref 0–200)
HDL: 50 mg/dL (ref >=46)
LDL Cholesterol: 67 mg/dL (ref 0–99)
TRIGLYCERIDES: 58 mg/dL (ref <150)
VLDL: 12 mg/dL (ref 0–40)

## 2014-12-20 LAB — VITAMIN D 25 HYDROXY (VIT D DEFICIENCY, FRACTURES): VIT D 25 HYDROXY: 29 ng/mL — AB (ref 30–100)

## 2014-12-20 LAB — FOLLICLE STIMULATING HORMONE: FSH: 79.6 m[IU]/mL

## 2014-12-21 ENCOUNTER — Telehealth: Payer: Self-pay | Admitting: *Deleted

## 2014-12-21 LAB — CYTOLOGY - PAP

## 2014-12-21 NOTE — Telephone Encounter (Signed)
Pt called and left message in voicemail asking if she should be taking 20,000 units or 2,000 units daily. Per result note 12/19/14 it should be 2,000 I left this on pt voicmail

## 2015-06-26 ENCOUNTER — Telehealth: Payer: Self-pay | Admitting: *Deleted

## 2015-06-26 NOTE — Telephone Encounter (Signed)
Good to hear that she is exercising and eating better continue. Had she been having some bleeding monthly after Prometrium/each month or is this first month that this has occurred? If this is the first time it is occurred watch at this time if it continues to be heavy call. Has it been heavy/changing tampons  4-5 times a day the entire 2 weeks?

## 2015-06-26 NOTE — Telephone Encounter (Signed)
Telephone call, reviewed since adenopathy in without cycles for more than one year, not truly menopausal, her FSH was elevated and is feeling much better on HRT, bleeds each month after Prometrium this month just a little longer. Will watch at this time. If continues to bleed greater than one week after Prometrium instructed to call.

## 2015-06-26 NOTE — Telephone Encounter (Signed)
patient has been bleeding monthly with the Prometrium, this would not be the first month. Pt said bleeding would be light at times and increase, the heavier bleeding has not been the whole 2 weeks, states each month the bleeding goes on for extra day. Please advise

## 2015-06-26 NOTE — Telephone Encounter (Signed)
Pt takes climara patch once weekly and Prometrium 200 mg day 1-12 monthly, aware that bleeding can occur while taking Prometrium but this month she has been bleeding 2 weeks after taking Rx. Wearing tampons using about 4-5 a day, medium period flow, only new change started exercising more and changed her eating habits, still smokes, trying to quit. Pt asked is this normal? Should dose be changed? Recommendations? Please advise

## 2015-10-22 ENCOUNTER — Encounter: Payer: Self-pay | Admitting: Obstetrics and Gynecology

## 2015-10-22 ENCOUNTER — Ambulatory Visit (INDEPENDENT_AMBULATORY_CARE_PROVIDER_SITE_OTHER): Payer: BLUE CROSS/BLUE SHIELD | Admitting: Obstetrics and Gynecology

## 2015-10-22 VITALS — BP 110/70 | HR 80 | Resp 14 | Ht 63.25 in | Wt 168.0 lb

## 2015-10-22 DIAGNOSIS — N946 Dysmenorrhea, unspecified: Secondary | ICD-10-CM

## 2015-10-22 DIAGNOSIS — Z79890 Hormone replacement therapy (postmenopausal): Secondary | ICD-10-CM | POA: Diagnosis not present

## 2015-10-22 DIAGNOSIS — N939 Abnormal uterine and vaginal bleeding, unspecified: Secondary | ICD-10-CM

## 2015-10-22 LAB — TSH: TSH: 2.23 m[IU]/L

## 2015-10-22 MED ORDER — MISOPROSTOL 200 MCG PO TABS
ORAL_TABLET | ORAL | Status: AC
Start: 2015-10-22 — End: ?

## 2015-10-22 MED ORDER — PROGESTERONE MICRONIZED 100 MG PO CAPS: 100.0000 mg | ORAL_CAPSULE | Freq: Every day | ORAL | Status: DC

## 2015-10-22 MED ORDER — ESTRADIOL 0.05 MG/24HR TD PTWK: 0.0500 mg | MEDICATED_PATCH | TRANSDERMAL | Status: DC

## 2015-10-22 NOTE — Patient Instructions (Signed)
Hormone Therapy At menopause, your body begins making less estrogen and progesterone hormones. This causes the body to stop having menstrual periods. This is because estrogen and progesterone hormones control your periods and menstrual cycle. A lack of estrogen may cause symptoms such as:  Hot flushes (or hot flashes).  Vaginal dryness.  Dry skin.  Loss of sex drive.  Risk of bone loss (osteoporosis). When this happens, you may choose to take hormone therapy to get back the estrogen lost during menopause. When the hormone estrogen is given alone, it is usually referred to as ET (Estrogen Therapy). When the hormone progestin is combined with estrogen, it is generally called HT (Hormone Therapy). This was formerly known as hormone replacement therapy (HRT). Your caregiver can help you make a decision on what will be best for you. The decision to use HT seems to change often as new studies are done. Many studies do not agree on the benefits of hormone replacement therapy. LIKELY BENEFITS OF HT INCLUDE PROTECTION FROM:  Hot Flushes (also called hot flashes) - A hot flush is a sudden feeling of heat that spreads over the face and body. The skin may redden like a blush. It is connected with sweats and sleep disturbance. Women going through menopause may have hot flushes a few times a month or several times per day depending on the woman.  Osteoporosis (bone loss) - Estrogen helps guard against bone loss. After menopause, a woman's bones slowly lose calcium and become weak and brittle. As a result, bones are more likely to break. The hip, wrist, and spine are affected most often. Hormone therapy can help slow bone loss after menopause. Weight bearing exercise and taking calcium with vitamin D also can help prevent bone loss. There are also medications that your caregiver can prescribe that can help prevent osteoporosis.  Vaginal dryness - Loss of estrogen causes changes in the vagina. Its lining may  become thin and dry. These changes can cause pain and bleeding during sexual intercourse. Dryness can also lead to infections. This can cause burning and itching. (Vaginal estrogen treatment can help relieve pain, itching, and dryness.)  Urinary tract infections are more common after menopause because of lack of estrogen. Some women also develop urinary incontinence because of low estrogen levels in the vagina and bladder.  Possible other benefits of estrogen include a positive effect on mood and short-term memory in women. RISKS AND COMPLICATIONS  Using estrogen alone without progesterone causes the lining of the uterus to grow. This increases the risk of lining of the uterus (endometrial) cancer. Your caregiver should give another hormone called progestin if you have a uterus.  Women who take combined (estrogen and progestin) HT appear to have an increased risk of breast cancer. The risk appears to be small, but increases throughout the time that HT is taken.  Combined therapy also makes the breast tissue slightly denser which makes it harder to read mammograms (breast X-rays).  Combined, estrogen and progesterone therapy can be taken together every day, in which case there may be spotting of blood. HT therapy can be taken cyclically in which case you will have menstrual periods. Cyclically means HT is taken for a set amount of days, then not taken, then this process is repeated.  HT may increase the risk of stroke, heart attack, breast cancer and forming blood clots in your leg.  Transdermal estrogen (estrogen that is absorbed through the skin with a patch or a cream) may have better results with:  Cholesterol.  Blood pressure.  Blood clots. Having the following conditions may indicate you should not have HT:  Endometrial cancer.  Liver disease.  Breast cancer.  Heart disease.  History of blood clots.  Stroke. TREATMENT   If you choose to take HT and have a uterus, usually  estrogen and progestin are prescribed.  Your caregiver will help you decide the best way to take the medications.  Possible ways to take estrogen include:  Pills.  Patches.  Gels.  Sprays.  Vaginal estrogen cream, rings and tablets.  It is best to take the lowest dose possible that will help your symptoms and take them for the shortest period of time that you can.  Hormone therapy can help relieve some of the problems (symptoms) that affect women at menopause. Before making a decision about HT, talk to your caregiver about what is best for you. Be well informed and comfortable with your decisions. HOME CARE INSTRUCTIONS   Follow your caregivers advice when taking the medications.  A Pap test is done to screen for cervical cancer.  The first Pap test should be done at age 34.  Between ages 80 and 52, Pap tests are repeated every 2 years.  Beginning at age 13, you are advised to have a Pap test every 3 years as long as the past 3 Pap tests have been normal.  Some women have medical problems that increase the chance of getting cervical cancer. Talk to your caregiver about these problems. It is especially important to talk to your caregiver if a new problem develops soon after your last Pap test. In these cases, your caregiver may recommend more frequent screening and Pap tests.  The above recommendations are the same for women who have or have not gotten the vaccine for HPV (human papillomavirus).  If you had a hysterectomy for a problem that was not a cancer or a condition that could lead to cancer, then you no longer need Pap tests. However, even if you no longer need a Pap test, a regular exam is a good idea to make sure no other problems are starting.  If you are between ages 20 and 60, and you have had normal Pap tests going back 10 years, you no longer need Pap tests. However, even if you no longer need a Pap test, a regular exam is a good idea to make sure no other problems  are starting.  If you have had past treatment for cervical cancer or a condition that could lead to cancer, you need Pap tests and screening for cancer for at least 20 years after your treatment.  If Pap tests have been discontinued, risk factors (such as a new sexual partner)need to be re-assessed to determine if screening should be resumed.  Some women may need screenings more often if they are at high risk for cervical cancer.  Get mammograms done as per the advice of your caregiver. SEEK IMMEDIATE MEDICAL CARE IF:  You develop abnormal vaginal bleeding.  You have pain or swelling in your legs, shortness of breath, or chest pain.  You develop dizziness or headaches.  You have lumps or changes in your breasts or armpits.  You have slurred speech.  You develop weakness or numbness of your arms or legs.  You have pain, burning, or bleeding when urinating.  You develop abdominal pain.   This information is not intended to replace advice given to you by your health care provider. Make sure you discuss any questions  you have with your health care provider.   Document Released: 05/16/2003 Document Revised: 01/01/2015 Document Reviewed: 02/18/2015 Elsevier Interactive Patient Education 2016 [employer] Abnormal Uterine Bleeding Abnormal uterine bleeding can affect women at various stages in life, including teenagers, women in their reproductive years, pregnant women, and women who have reached menopause. Several kinds of uterine bleeding are considered abnormal, including:  Bleeding or spotting between periods.   Bleeding after sexual intercourse.   Bleeding that is heavier or more than normal.   Periods that last longer than usual.  Bleeding after menopause.  Many cases of abnormal uterine bleeding are minor and simple to treat, while others are more serious. Any type of abnormal bleeding should be evaluated by your health care provider. Treatment will depend on the  cause of the bleeding. HOME CARE INSTRUCTIONS Monitor your condition for any changes. The following actions may help to alleviate any discomfort you are experiencing:  Avoid the use of tampons and douches as directed by your health care provider.  Change your pads frequently. You should get regular pelvic exams and Pap tests. Keep all follow-up appointments for diagnostic tests as directed by your health care provider.  SEEK MEDICAL CARE IF:   Your bleeding lasts more than 1 week.   You feel dizzy at times.  SEEK IMMEDIATE MEDICAL CARE IF:   You pass out.   You are changing pads every 15 to 30 minutes.   You have abdominal pain.  You have a fever.   You become sweaty or weak.   You are passing large blood clots from the vagina.   You start to feel nauseous and vomit. MAKE SURE YOU:   Understand these instructions.  Will watch your condition.  Will get help right away if you are not doing well or get worse.   This information is not intended to replace advice given to you by your health care provider. Make sure you discuss any questions you have with your health care provider.   Document Released: 08/17/2005 Document Revised: 08/22/2013 Document Reviewed: 03/16/2013 Elsevier Interactive Patient Education [employer]

## 2015-10-22 NOTE — Progress Notes (Signed)
Patient ID: Margaret Brady, female   DOB: 04-25-1966, 50 y.o.   MRN: [id number] 50 y.o. G2P1011 Significant OtherCaucasianF here for painful irregular  menstrual cycles.   Over the last 3 years, the patient was having irregular, light bleeding that would last a few days. Typically spotting every 2-4 months. She started cyclic HRT in April of 2016 for nightsweats, fatigue, depression, vaginal dryness, and a decrease in libido. She gained 20 lbs within a couple of months. She started on a nutritional system and lost the 20 lbs, she is feeling a lot better.  Since she started HRT her bleeding has gotten worse. She is bleeding monthly. She starts bleeding day 4-5 of the prometrium and bleeds for 10-12 days. She is saturating a regular tampon in 3-4 hours. Cramps are terrible, this is worse than normal. She is having trouble functioning with her cycle. No vasomotor symptoms. No vaginal dryness, sleeping well. She would like to wean off of the HRT if possible.     Patient's last menstrual period was 10/02/2015.    bleed until around the 11th      Sexually active: Yes.    The current method of family planning is none.    Exercising: Yes.    pilates Smoker:  Yes, about 5 cigarettes a day.   Health Maintenance: Pap:  12-19-14 WNL History of abnormal Pap:  Yes, h/o cryosurgery more than 16 years ago. MMG:  7 yrs ago Colonoscopy:  Never BMD:   Never TDaP:  08/2015 Gardasil: N/A   reports that she has been smoking Cigarettes.  She has a .5 pack-year smoking history. She has never used smokeless tobacco. 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 Daughter is 16.  Past Medical History  Diagnosis Date  . Complication of anesthesia     spinal with section n&v  . Depression     hx postpartum dep  . Headache(784.0)   . Arthritis   . Hormone disorder   . Anxiety   . Abnormal uterine bleeding     Past  Surgical History  Procedure Laterality Date  . Cesarean section      2001.  . Ercp N/A 11/16/2013    Procedure: ENDOSCOPIC RETROGRADE CHOLANGIOPANCREATOGRAPHY (ERCP);  Surgeon: Louis Meckel, MD;  Location: [facility] OR;  Service: Endoscopy;  Laterality: N/A;  . Cholecystectomy N/A 11/16/2013    Procedure: LAPAROSCOPIC CHOLECYSTECTOMY WITH INTRAOPERATIVE CHOLANGIOGRAM;  Surgeon: Axel Filler, MD;  Location: [facility] OR;  Service: General;  Laterality: N/A;  . Gynecologic cryosurgery    Long time ago  Current Outpatient Prescriptions  Medication Sig Dispense Refill  . estradiol (CLIMARA - DOSED IN MG/24 HR) 0.05 mg/24hr patch Place 1 patch (0.05 mg total) onto the skin once a week. 4 patch 1  . misoprostol (CYTOTEC) 200 MCG tablet Place 2 tablets intravaginally 6-12 hours prior to your appointment. 2 tablet 0  . progesterone (PROMETRIUM) 100 MG capsule Take 1 capsule (100 mg total) by mouth daily. 30 capsule 1   No current facility-administered medications for this visit.    Family History  Problem Relation Age of Onset  . Lymphoma Mother   . Heart attack Father     Review of Systems  Constitutional: Negative.   HENT: Negative.   Eyes: Negative.   Respiratory: Negative.   Cardiovascular: Negative.   Gastrointestinal: Negative.   Endocrine: Negative.   Genitourinary: Positive for menstrual problem.  Painful irregular menstrual cycles   Musculoskeletal: Negative.   Skin: Negative.   Allergic/Immunologic: Negative.   Neurological: Negative.   Psychiatric/Behavioral: Negative.     Exam:   BP 110/70 mmHg  Pulse 80  Resp 14  Ht 5' 3.25" (1.607 m)  Wt 168 lb (76.204 kg)  BMI 29.51 kg/m2  LMP 10/02/2015  Weight change: @ Height:   Height: 5' 3.25" (160.7 cm)  Ht Readings from Last 3 Encounters:  10/22/15 5' 3.25" (1.607 m)  12/19/14  (1.626 m)  11/16/13  (1.626 m)    General appearance: alert, cooperative and appears stated age Head: Normocephalic,  without obvious abnormality, atraumatic Neck: no adenopathy, supple, symmetrical, trachea midline and thyroid normal to inspection and palpation Lungs: clear to auscultation bilaterally Heart: regular rate and rhythm Abdomen: soft, non-tender; bowel sounds normal; no masses,  no organomegaly Extremities: extremities normal, atraumatic, no cyanosis or edema Skin: Skin color, texture, turgor normal. No rashes or lesions Lymph nodes: Cervical, supraclavicular nodes normal. No abnormal inguinal nodes palpated Neurologic: Grossly normal   Pelvic: External genitalia:  no lesions              Urethra:  normal appearing urethra with no masses, tenderness or lesions              Bartholins and Skenes: normal                 Vagina: normal appearing vagina with normal color and discharge, no lesions              Cervix: no lesions               Bimanual Exam:  Uterus:  normal size, contour, position, consistency, mobility, non-tender and retroverted              Adnexa: no mass, fullness, tenderness                Chaperone was present for exam.  A:  Abnormal bleeding on cyclic HRT  Severe dysmenorrhea, new  P:   TSH, FSH (FSH was elevated last year)  Return for an ultrasound, possible sonohysterogram, possible endometrial biopsy  She would like to wean off of HRT  Will decrease her HRT to 0.05 mg climara patch and change to daily prometrium, 100 mg qhs(she will wait until she has her w/d bleed to change to the 100 mg dose)  The patient is due for her annual exam in April.   Will discuss mammogram with her at her next appointment.

## 2015-10-23 ENCOUNTER — Encounter: Payer: Self-pay | Admitting: Obstetrics and Gynecology

## 2015-10-23 ENCOUNTER — Telehealth: Payer: Self-pay | Admitting: Obstetrics and Gynecology

## 2015-10-23 LAB — FOLLICLE STIMULATING HORMONE: FSH: 49.9 m[IU]/mL

## 2015-10-23 NOTE — Telephone Encounter (Signed)
Called patient to review benefits for a recommended procedure. Left Voicemail requesting a call back. °

## 2015-10-24 NOTE — Telephone Encounter (Signed)
Returned patients call, left message on machine to call back

## 2015-10-24 NOTE — Telephone Encounter (Signed)
Patient returned call

## 2015-10-29 ENCOUNTER — Ambulatory Visit (INDEPENDENT_AMBULATORY_CARE_PROVIDER_SITE_OTHER): Payer: BLUE CROSS/BLUE SHIELD

## 2015-10-29 ENCOUNTER — Other Ambulatory Visit: Payer: Self-pay | Admitting: Obstetrics and Gynecology

## 2015-10-29 ENCOUNTER — Ambulatory Visit (INDEPENDENT_AMBULATORY_CARE_PROVIDER_SITE_OTHER): Payer: BLUE CROSS/BLUE SHIELD | Admitting: Obstetrics and Gynecology

## 2015-10-29 ENCOUNTER — Encounter: Payer: Self-pay | Admitting: Obstetrics and Gynecology

## 2015-10-29 VITALS — BP 110/62 | HR 60 | Resp 14 | Wt 168.0 lb

## 2015-10-29 DIAGNOSIS — Z79890 Hormone replacement therapy (postmenopausal): Secondary | ICD-10-CM

## 2015-10-29 DIAGNOSIS — N939 Abnormal uterine and vaginal bleeding, unspecified: Secondary | ICD-10-CM

## 2015-10-29 DIAGNOSIS — N946 Dysmenorrhea, unspecified: Secondary | ICD-10-CM

## 2015-10-29 NOTE — Progress Notes (Signed)
Patient ID: Margaret Brady, female   DOB: 12/27/1965, 50 y.o.   MRN: [id number] GYNECOLOGY  VISIT   HPI: 50 y.o.   Significant Other  Caucasian  female   G2P1011 with Patient's last menstrual period was 10/02/2015.   here for sonohysterogram for abnormal bleeding on HRT. Since starting cyclic HRT her cycles have been heavier and very crampy. She is bleeding for 10-12 days a month. She was noted to have a stenotic cervix at her last visit and was given cytotec to take prior to todays visit. She is currently on the cyclic prometrium (200 mg) and will switch to the 100 mg a few days after stopping the 200 mg. She will then take it daily. At her last visit she was given a lower dose estrogen patch. She hasn't changed over yet.   GYNECOLOGIC HISTORY: Patient's last menstrual period was 10/02/2015. Contraception:postmenopause Menopausal hormone therapy: estradiol/progesterone         OB History    Gravida Para Term Preterm AB TAB SAB Ectopic Multiple Living   0 1 0 1 0 0 1         Patient Active Problem List   Diagnosis Date Noted  . S/P laparoscopic cholecystectomy 11/16/2013  . Calculus of bile duct without mention of cholecystitis or obstruction 11/16/2013  . MOOD SWINGS 05/11/2007  . ALLERGIC RHINITIS 05/11/2007    Past Medical History  Diagnosis Date  . Complication of anesthesia     spinal with section n&v  . Depression     hx postpartum dep  . Headache(784.0)   . Arthritis   . Hormone disorder   . Anxiety   . Abnormal uterine bleeding     Past Surgical History  Procedure Laterality Date  . Cesarean section      2001.  . Ercp N/A 11/16/2013    Procedure: ENDOSCOPIC RETROGRADE CHOLANGIOPANCREATOGRAPHY (ERCP);  Surgeon: Louis Meckel, MD;  Location: [facility] OR;  Service: Endoscopy;  Laterality: N/A;  . Cholecystectomy N/A 11/16/2013    Procedure: LAPAROSCOPIC CHOLECYSTECTOMY WITH INTRAOPERATIVE CHOLANGIOGRAM;  Surgeon: Axel Filler, MD;  Location: [facility] OR;  Service:  General;  Laterality: N/A;  . Gynecologic cryosurgery      Current Outpatient Prescriptions  Medication Sig Dispense Refill  . estradiol (CLIMARA - DOSED IN MG/24 HR) 0.05 mg/24hr patch Place 1 patch (0.05 mg total) onto the skin once a week. 4 patch 1  . misoprostol (CYTOTEC) 200 MCG tablet Place 2 tablets intravaginally 6-12 hours prior to your appointment. 2 tablet 0  . progesterone (PROMETRIUM) 100 MG capsule Take 1 capsule (100 mg total) by mouth daily. 30 capsule 1  . progesterone (PROMETRIUM) 200 MG capsule      No current facility-administered medications for this visit.     ALLERGIES: Review of patient's allergies indicates no known allergies.  Family History  Problem Relation Age of Onset  . Lymphoma Mother   . Heart attack Father     Social History   Social History  . Marital Status: Significant Other    Spouse Name: N/A  . Number of Children: N/A  . Years of Education: N/A   Occupational History  . Not on file.   Social History Main Topics  . Smoking status: Current Some Day Smoker -- 51 packs/day for 2 years    Types: Cigarettes  . Smokeless tobacco: Never Used     Comment: quiting  down to 2 cig a day  . Alcohol Use: No  . Drug  Use: No  . Sexual Activity:    Partners: Male     Comment: INTERCOURSE AGE 50, SEXUAL PARTNERS MORE THAN 5   Other Topics Concern  . Not on file   Social History Narrative    Review of Systems  Constitutional: Negative.   HENT: Negative.   Eyes: Negative.   Respiratory: Negative.   Cardiovascular: Negative.   Gastrointestinal: Negative.   Genitourinary: Negative.   Musculoskeletal: Negative.   Skin: Negative.   Neurological: Negative.   Endo/Heme/Allergies: Negative.   Psychiatric/Behavioral: Negative.     PHYSICAL EXAMINATION:    BP 110/62 mmHg  Pulse 60  Resp 14  Wt 168 lb (76.204 kg)  LMP 10/02/2015    General appearance: alert, cooperative and appears stated age  Pelvic: External genitalia:  no  lesions              Urethra:  normal appearing urethra with no masses, tenderness or lesions              Bartholins and Skenes: normal                 Vagina: normal appearing vagina with normal color and discharge, no lesions              Cervix: no lesions and stenotic   Sonohysterogram/endometrial biopsy The procedure and risks of the procedure were reviewed with the patient, consent form was signed (for both the sonohysterogram and the endometrial biopsy) A speculum was placed in the vagina and the cervix was cleansed with betadine. The cervix was stenotic, the catheter could not pass through the cervix. A tenaculum was placed at 12 o'clock and the cervix was dilated with the mini-dilators.The sonohysterogram catheter was inserted into the uterine cavity at this time. Saline was infused under direct observation with the ultrasound. No intracavitary defects were noted.The catheter was removed.  The speculum was reinserted. The mini-pipelle was placed in the uterine cavity and the endometrial biopsy was obtained. Minimal tissue was obtained. The tenaculum and speculum were removed.                  Chaperone was present for exam.  ASSESSMENT Abnormal uterine bleeding on cyclic HRT. Negative sonohysterogram  Severe dysmenorrhea, normal ultrasound  PLAN Endometrial biopsy obtained She is changing to daily HRT and decreasing the dose of her estradiol patch F/U in 6 weeks Stressed the importance of mammogram. She is just undergoing an insurance change and states she will get the mammogram when she gets her new insurance   An After Visit Summary was printed and given to the patient.  In addition to the sonohysterogram and endometrial biopsy, 10 minutes face to face time of which over 50% was spent in counseling.

## 2015-11-05 ENCOUNTER — Telehealth: Payer: Self-pay | Admitting: *Deleted

## 2015-11-05 NOTE — Telephone Encounter (Signed)
-----   Message from Romualdo BolkJill Evelyn Jertson, MD sent at 11/04/2015  5:06 PM EST ----- Please inform the patient that her biopsy was benign, but showed hormonal stimulation. The daily progesterone should help this.

## 2015-11-05 NOTE — Telephone Encounter (Signed)
LMTC in regards to lab results -eh 

## 2015-11-05 NOTE — Telephone Encounter (Signed)
spoke with patient- see result note-eh

## 2015-12-10 ENCOUNTER — Telehealth: Payer: Self-pay | Admitting: Obstetrics and Gynecology

## 2015-12-10 NOTE — Telephone Encounter (Signed)
Spoke with patient. Patient states that her insurance was supposed to kick in 11/30/2015 and it has not. She is working with her insurance company to fix this. She is due to refill her Progesterone 200 mg  and Estradiol 0.05 mg patches. Asking if there are samples available for this medication. Advised our office no longer has medication samples. Advised she may contact her pharmacy to have her prescriptions filled without her insurance for this month until her insurance coverage begins. Advised I am unsure how much this her medications will cost without insurance coverage, but her pharmacy will be able to let her know. If the cost is too expensive alternative medications can be discussed with Dr.Jertson. She is agreeable.  Routing to provider for final review. Patient agreeable to disposition. Will close encounter.

## 2015-12-10 NOTE — Telephone Encounter (Signed)
Patient called and requested samples of Estradiol and progesterone. She said, "I am having trouble with my insurance but I need to continue taking these medications. What should I do?"

## 2015-12-16 ENCOUNTER — Ambulatory Visit: Payer: BLUE CROSS/BLUE SHIELD | Admitting: Obstetrics and Gynecology

## 2015-12-16 ENCOUNTER — Telehealth: Payer: Self-pay | Admitting: *Deleted

## 2015-12-16 NOTE — Telephone Encounter (Signed)
Patient left a message to reschedule appointment for today due to insurance benefits, left message to reschedule.

## 2016-01-14 NOTE — Telephone Encounter (Signed)
Please call in another month of the prometrium, she is overdue for her mammogram. She really needs to have a mammogram, particularly if she is going to be on HRT. Her last pap was else where in 4/16. She should have an annual as well.

## 2016-01-14 NOTE — Telephone Encounter (Signed)
Spoke with patient. Patient states that she has been doing very well on Prometrium 100 mg and has not had any further bleeding since switching from Prometrium 200 mg. Patient states she only has a couple of days left of her current Prometrium 100 mg rx. Requesting a refill be sent to her pharmacy on file. Advised I will speak with Dr.Jertson regarding refill and return call. She is agreeable.  Dr.Jertson, okay to refill Prometrium 100 mg rx at this time?

## 2016-01-14 NOTE — Telephone Encounter (Signed)
Patient is calling to give an update to the nurse. °

## 2016-01-15 MED ORDER — PROGESTERONE MICRONIZED 100 MG PO CAPS: 100.0000 mg | ORAL_CAPSULE | Freq: Every day | ORAL | Status: AC

## 2016-01-15 NOTE — Telephone Encounter (Signed)
Left detailed message at number provided [phone]. Advised 1 month refill of Prometrium 100 mg has been sent to pharmacy on file. Advised she will need to have her mammogram and aex performed prior to receiving any further refills. Requested she return call to the office to schedule her aex at her earliest convenience.

## 2016-01-17 ENCOUNTER — Other Ambulatory Visit: Payer: Self-pay | Admitting: Obstetrics and Gynecology

## 2016-01-20 NOTE — Telephone Encounter (Signed)
Please send in a one month refill, she should need a refill on the Prometrium 100 mg as well

## 2016-01-20 NOTE — Telephone Encounter (Signed)
LMTC -eh  

## 2016-01-20 NOTE — Telephone Encounter (Signed)
She needs to have a mammogram prior to refilling her HRT

## 2016-01-20 NOTE — Telephone Encounter (Signed)
Medication refill request: Climara  Last AEX:  Never Last OV: 12-10-15 Next AEX: not scheduled Last MMG (if hormonal medication request): non on file  Refill authorized: please advise

## 2016-01-20 NOTE — Telephone Encounter (Signed)
Patient is currently with out insurance- She spoke [name] last week about this. (see phone note) A 1 month refill of Premarin was sent in then. She is requesting a 1 month refill of the patch until her insurance kicks in and she gets her MMG. She will have her insurance around the middle of June.  Please advise

## 2016-01-21 ENCOUNTER — Telehealth: Payer: Self-pay | Admitting: Obstetrics and Gynecology

## 2016-01-21 NOTE — Telephone Encounter (Signed)
Patient is aware RX was sent in -eh

## 2016-01-21 NOTE — Telephone Encounter (Signed)
Patient is calling to check on the status of a refill for her progesterone patch.

## 2016-01-29 ENCOUNTER — Other Ambulatory Visit: Payer: Self-pay | Admitting: Obstetrics and Gynecology

## 2016-01-29 DIAGNOSIS — Z1231 Encounter for screening mammogram for malignant neoplasm of breast: Secondary | ICD-10-CM

## 2016-01-30 ENCOUNTER — Ambulatory Visit
Admission: RE | Admit: 2016-01-30 | Discharge: 2016-01-30 | Disposition: A | Discharge status: Home / Self Care | Payer: No Typology Code available for payment source | Source: Ambulatory Visit | Attending: Obstetrics and Gynecology | Admitting: Obstetrics and Gynecology

## 2016-01-30 DIAGNOSIS — Z1231 Encounter for screening mammogram for malignant neoplasm of breast: Secondary | ICD-10-CM

## 2016-02-17 ENCOUNTER — Telehealth: Payer: Self-pay | Admitting: Obstetrics and Gynecology

## 2016-02-17 NOTE — Telephone Encounter (Signed)
Patient wants to speak with Dr. Salli QuarryJertson's nurse. No information given.

## 2016-02-17 NOTE — Telephone Encounter (Signed)
Return call to patient: Patient calling for HRT refill before she leaves on vacation. States she had mammogram and we should have results. Advised she is due for annual exam, last exam 11-2014. Annual exam scheduled for tomorrow 02-18-16 with Dr Oscar LaJertson.  Routing to provider for final review. Patient agreeable to disposition. Will close encounter.

## 2016-02-18 ENCOUNTER — Ambulatory Visit: Payer: BLUE CROSS/BLUE SHIELD | Admitting: Obstetrics and Gynecology

## 2016-02-18 ENCOUNTER — Encounter: Payer: Self-pay | Admitting: Obstetrics and Gynecology

## 2017-01-13 ENCOUNTER — Encounter: Payer: Self-pay | Admitting: Gynecology

## 2017-02-27 IMAGING — MG MS DIGITAL SCREENING BILATERAL
4 series · 4 of 4 positions shown · non-contrast
Comparison: None.

CLINICAL DATA: Screening.

EXAM:
DIGITAL SCREENING BILATERAL MAMMOGRAM WITH CAD

[R CC]
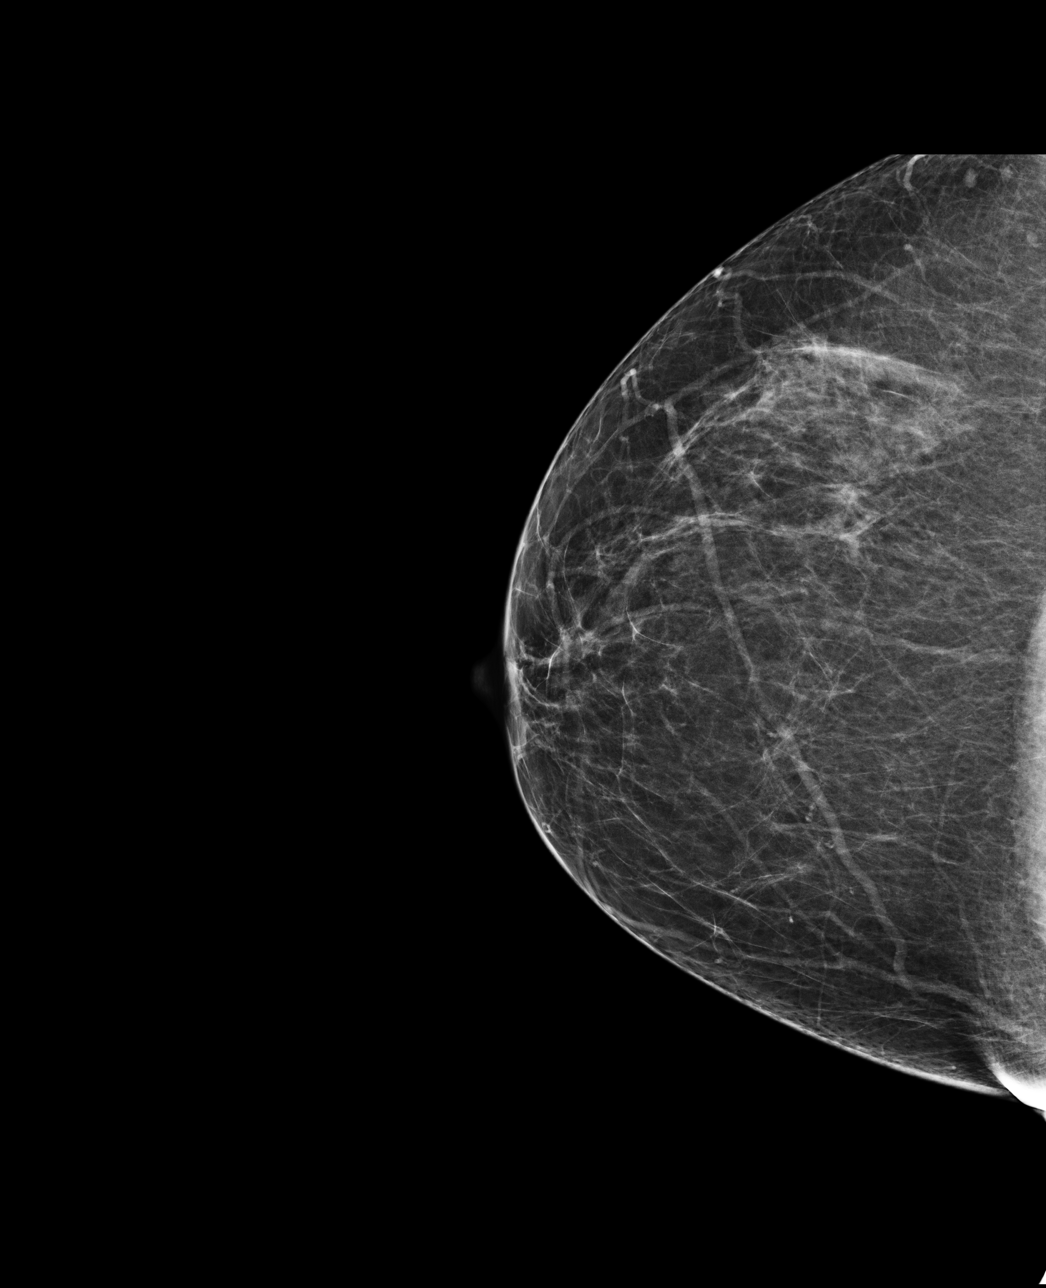

[L MLO]
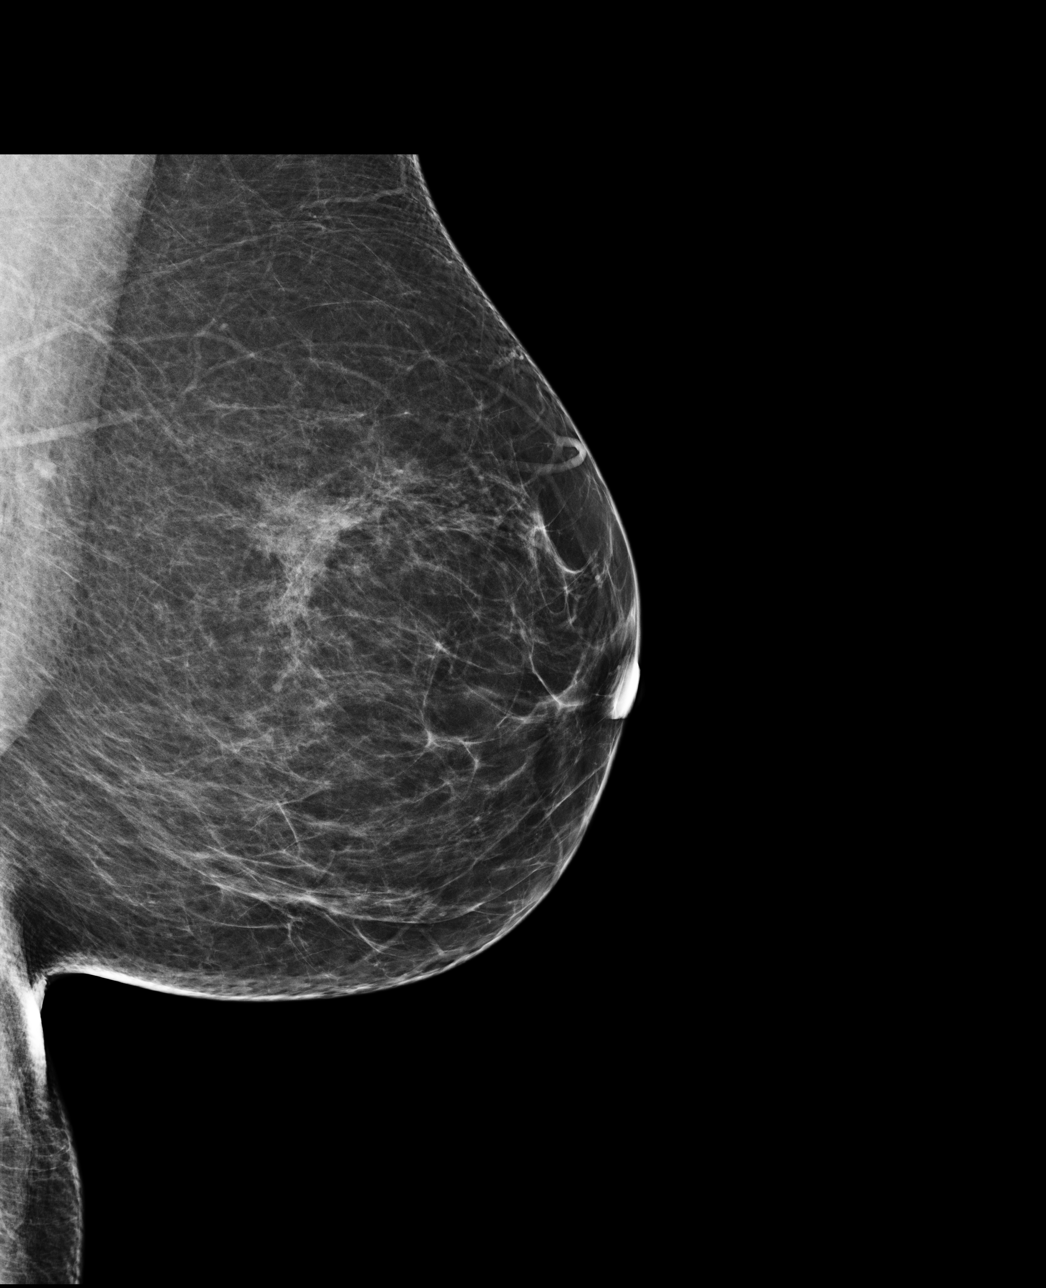

[R MLO]
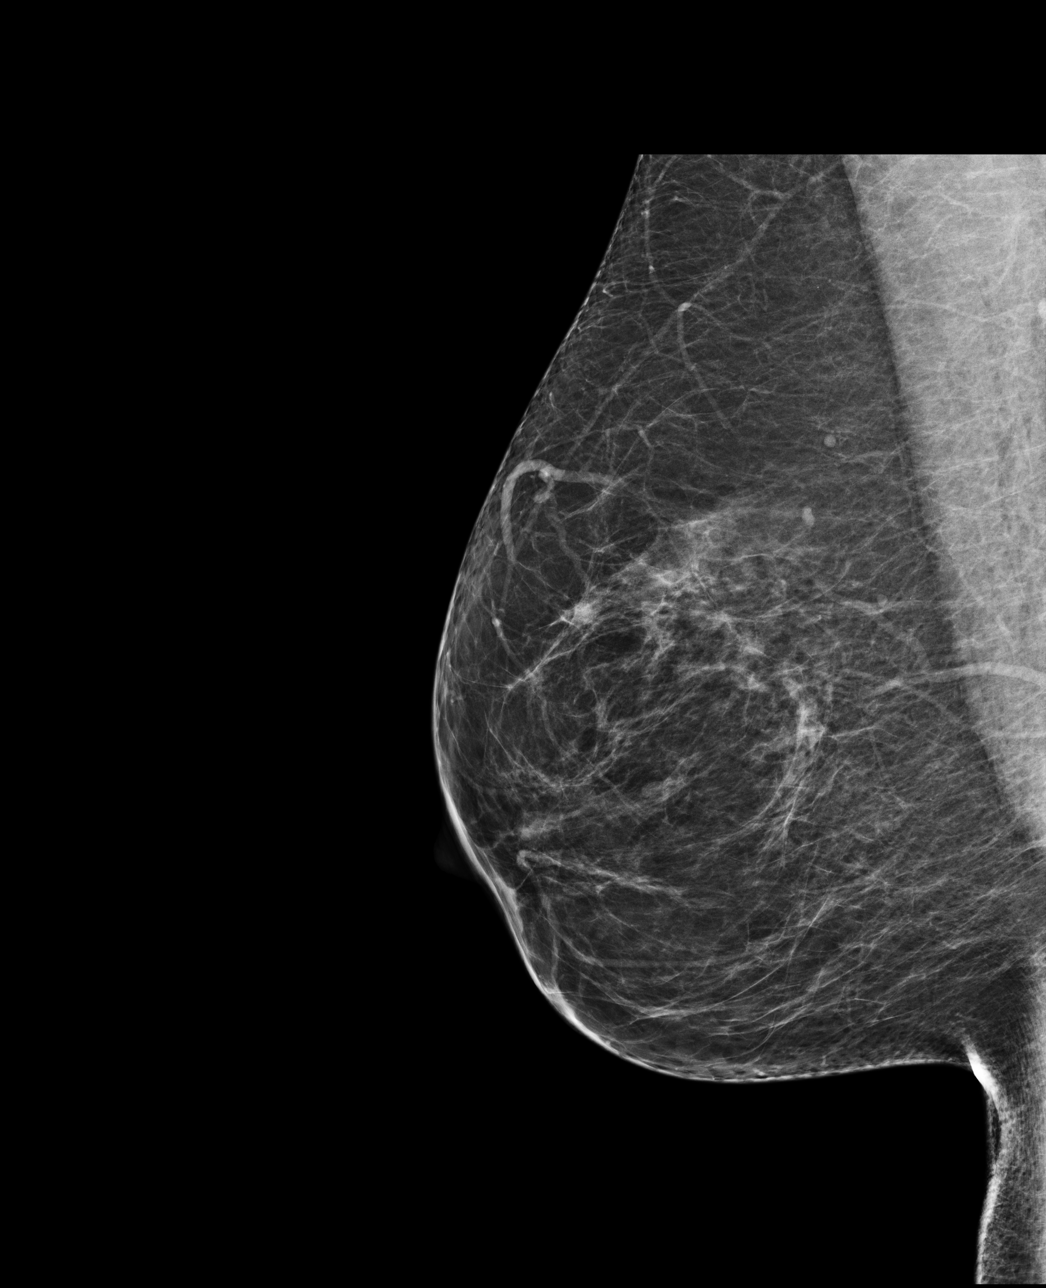

[L CC]
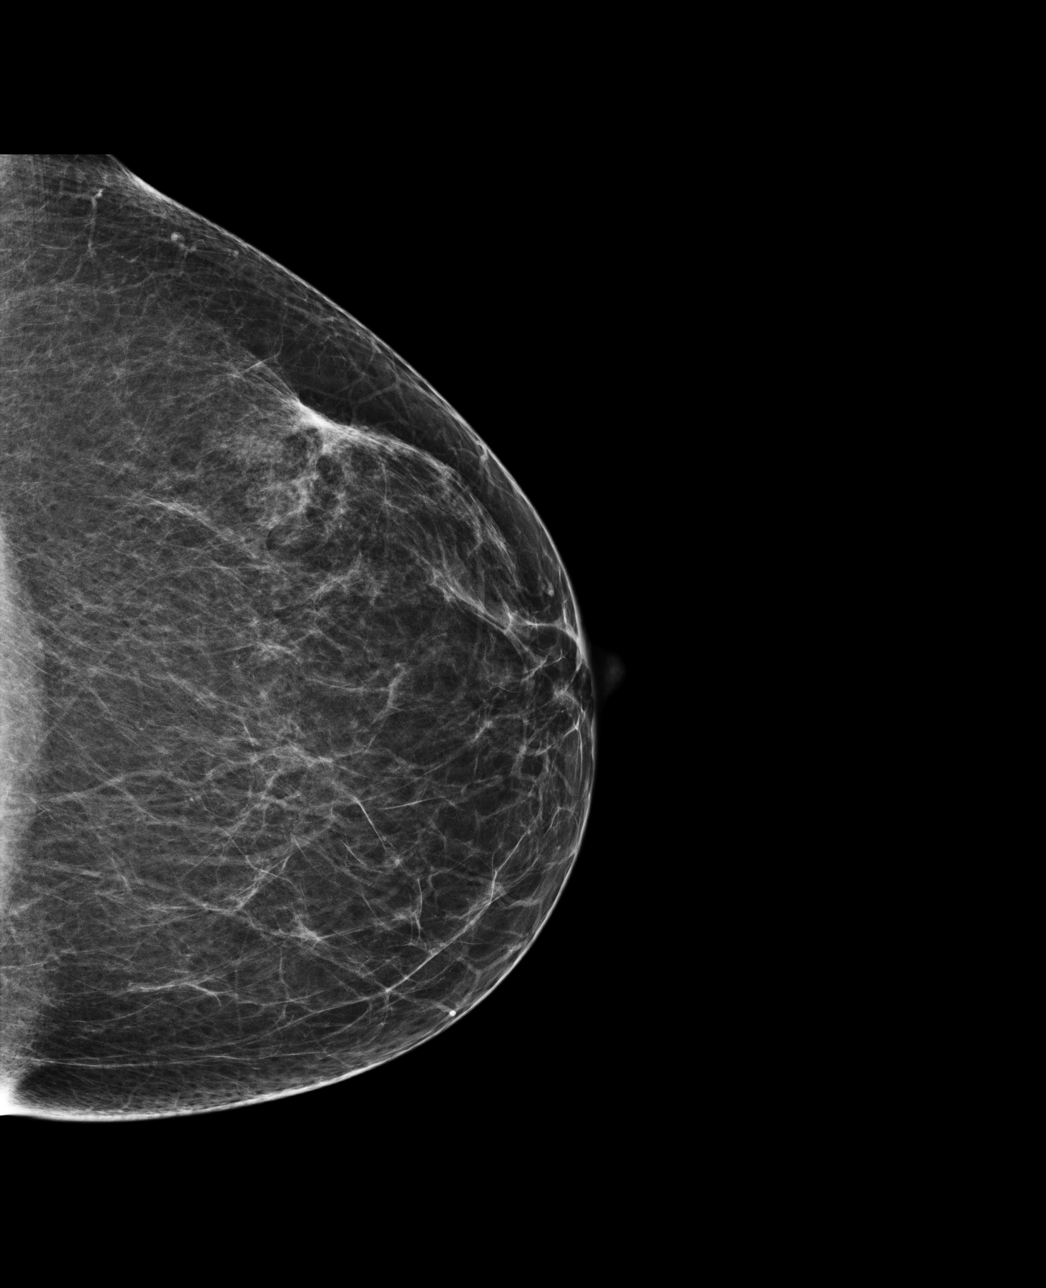

[4 of 4 positions shown; findings below may reference images not displayed]

ACR Breast Density Category b: There are scattered areas of
fibroglandular density.
FINDINGS: There are no findings suspicious for malignancy. Images were
processed with CAD.
IMPRESSION: No mammographic evidence of malignancy. A result letter of this
screening mammogram will be mailed directly to the patient.

RECOMMENDATION:
Screening mammogram in one year. (Code:[id number])

BI-RADS CATEGORY  1: Negative.

## 2020-08-02 ENCOUNTER — Other Ambulatory Visit: Payer: Self-pay | Admitting: Physician Assistant

## 2020-08-02 DIAGNOSIS — Z1231 Encounter for screening mammogram for malignant neoplasm of breast: Secondary | ICD-10-CM

## 2020-09-13 ENCOUNTER — Ambulatory Visit: Payer: No Typology Code available for payment source

## 2020-10-23 ENCOUNTER — Ambulatory Visit: Payer: No Typology Code available for payment source

## 2020-12-13 ENCOUNTER — Inpatient Hospital Stay: Admission: RE | Admit: 2020-12-13 | Payer: No Typology Code available for payment source | Source: Ambulatory Visit

## 2021-01-31 ENCOUNTER — Ambulatory Visit: Payer: No Typology Code available for payment source
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
## 2022-02-24 ENCOUNTER — Ambulatory Visit (INDEPENDENT_AMBULATORY_CARE_PROVIDER_SITE_OTHER): Payer: 59

## 2022-02-24 ENCOUNTER — Ambulatory Visit (INDEPENDENT_AMBULATORY_CARE_PROVIDER_SITE_OTHER): Payer: 59 | Admitting: Family Medicine

## 2022-02-24 VITALS — BP 130/80 | HR 66 | Ht 63.0 in | Wt 183.0 lb

## 2022-02-24 DIAGNOSIS — M9908 Segmental and somatic dysfunction of rib cage: Secondary | ICD-10-CM

## 2022-02-24 DIAGNOSIS — M545 Low back pain, unspecified: Secondary | ICD-10-CM

## 2022-02-24 DIAGNOSIS — M9901 Segmental and somatic dysfunction of cervical region: Secondary | ICD-10-CM | POA: Diagnosis not present

## 2022-02-24 DIAGNOSIS — M9903 Segmental and somatic dysfunction of lumbar region: Secondary | ICD-10-CM | POA: Diagnosis not present

## 2022-02-24 DIAGNOSIS — G8929 Other chronic pain: Secondary | ICD-10-CM

## 2022-02-24 DIAGNOSIS — M9904 Segmental and somatic dysfunction of sacral region: Secondary | ICD-10-CM | POA: Diagnosis not present

## 2022-02-24 DIAGNOSIS — M9902 Segmental and somatic dysfunction of thoracic region: Secondary | ICD-10-CM | POA: Diagnosis not present

## 2022-02-24 NOTE — Assessment & Plan Note (Signed)
Patient is a low back pain.  Seems to be multifactorial.  He does have weakness noted in the hip abductors.  Discussed with patient about core strengthening, and the importance of weight loss.  Discussed which activities to do which ones to avoid.  Discussed with patient about avoiding certain activities.  Patient wants to hold on any medications and discussed over-the-counter occasions.  Responding well to manipulation.  X-rays are pending follow-up in 6 weeks

## 2022-02-24 NOTE — Assessment & Plan Note (Signed)

## 2022-02-25 ENCOUNTER — Encounter: Payer: Self-pay | Admitting: Family Medicine

## 2022-03-09 ENCOUNTER — Other Ambulatory Visit (HOSPITAL_BASED_OUTPATIENT_CLINIC_OR_DEPARTMENT_OTHER): Payer: Self-pay | Admitting: Family Medicine

## 2022-03-09 DIAGNOSIS — Z1231 Encounter for screening mammogram for malignant neoplasm of breast: Secondary | ICD-10-CM

## 2022-03-13 ENCOUNTER — Ambulatory Visit (HOSPITAL_BASED_OUTPATIENT_CLINIC_OR_DEPARTMENT_OTHER)
Admission: RE | Admit: 2022-03-13 | Discharge: 2022-03-13 | Disposition: A | Discharge status: Home / Self Care | Payer: 59 | Source: Ambulatory Visit | Attending: Family Medicine | Admitting: Family Medicine

## 2022-03-13 DIAGNOSIS — Z1231 Encounter for screening mammogram for malignant neoplasm of breast: Secondary | ICD-10-CM | POA: Insufficient documentation

## 2022-03-17 ENCOUNTER — Other Ambulatory Visit: Payer: Self-pay | Admitting: Family Medicine

## 2022-03-17 DIAGNOSIS — R928 Other abnormal and inconclusive findings on diagnostic imaging of breast: Secondary | ICD-10-CM

## 2022-03-23 ENCOUNTER — Ambulatory Visit
Admission: RE | Admit: 2022-03-23 | Discharge: 2022-03-23 | Disposition: A | Discharge status: Home / Self Care | Payer: 59 | Source: Ambulatory Visit | Attending: Family Medicine | Admitting: Family Medicine

## 2022-03-23 ENCOUNTER — Other Ambulatory Visit: Payer: Self-pay | Admitting: Family Medicine

## 2022-03-23 ENCOUNTER — Other Ambulatory Visit: Payer: Self-pay | Admitting: Physician Assistant

## 2022-03-23 DIAGNOSIS — R928 Other abnormal and inconclusive findings on diagnostic imaging of breast: Secondary | ICD-10-CM

## 2022-03-23 DIAGNOSIS — N6313 Unspecified lump in the right breast, lower outer quadrant: Secondary | ICD-10-CM

## 2022-04-02 NOTE — Progress Notes (Signed)
[facility] [address] [place] [address] Phone: [phone] Subjective:   Bruce Donath, am serving as a scribe for Dr. Antoine Primas.  I'm seeing this patient by the request  of:  Roger Kill, PA-C  CC: back and neck pain   [id number]  Margaret Brady is a 56 y.o. female coming in with complaint of back and neck pain. OMT 02/24/2022. Patient states that turmeric and tart cherry have been helpful as well as the exercises. Went kayaking this past weekend and she now has soreness in L side of her lumbar spine. Feeling nausea and notes forceful urination and cramping in LRQ when urinating. Wondering if she has kidney stones.   Medications patient has been prescribed: None  Taking:         Reviewed prior external information including notes and imaging from previsou exam, outside providers and external EMR if available.   As well as notes that were available from care everywhere and other healthcare systems.  Past medical history, social, surgical and family history all reviewed in electronic medical record.  No pertanent information unless stated regarding to the chief complaint.   Past Medical History:  Diagnosis Date   Abnormal uterine bleeding    Anxiety    Arthritis    Complication of anesthesia    spinal with section n&v   Depression    hx postpartum dep   Headache(784.0)    Hormone disorder     No Known Allergies   Review of Systems:  No headache, visual changes, nausea, vomiting, diarrhea, constipation, dizziness, abdominal pain, skin rash, fevers, chills, night sweats, weight loss, swollen lymph nodes, body aches, joint swelling, chest pain, shortness of breath, mood changes. POSITIVE muscle aches, dysuria  Objective  Blood pressure 120/82, pulse 72, height 5\' 3"  (1.6 m), weight 183 lb (83 kg), last menstrual period 10/02/2015, SpO2 98 %.   General: No apparent distress alert and oriented x3 mood and affect  normal, dressed appropriately.  HEENT: Pupils equal, extraocular movements intact  Respiratory: Patient's speak in full sentences and does not appear short of breath  Cardiovascular: No lower extremity edema, non tender, no erythema  Gait normal  MSK:  Back does have some loss of lordosis. Patient has some tightness with FABER test bilaterally.  Some mild CVA tenderness that appears.  Some discomfort noted in the right lower quadrant as well.  Seems to be more voluntary guarding though noted.  Osteopathic findings  T8 extended rotated and side bent left L2 flexed rotated and side bent right Sacrum left on left       Assessment and Plan:  Low back pain Low back pain.  Seems to be more of the sacroiliac joint noted than anything else at this time.  Unfortunately patient has had bowel calcium in her bile duct previously and could be potentially having a urinary tract infection or the possibility of a kidney stone.  We will get a KUB and a urinalysis to make sure.  Discussed home exercises and icing regimen otherwise.  Follow-up again in 4 to 6 weeks    Nonallopathic problems  Decision today to treat with OMT was based on Physical Exam  After verbal consent patient was treated with HVLA, ME, FPR techniques in  thoracic, lumbar, and sacral  areas  Patient tolerated the procedure well with improvement in symptoms  Patient given exercises, stretches and lifestyle modifications  See medications in patient instructions if given  Patient will  follow up in 4-8 weeks    The above documentation has been reviewed and is accurate and complete Lyndal Pulley, DO          Note: This dictation was prepared with Dragon dictation along with smaller phrase technology. Any transcriptional errors that result from this process are unintentional.

## 2022-04-07 ENCOUNTER — Ambulatory Visit (INDEPENDENT_AMBULATORY_CARE_PROVIDER_SITE_OTHER): Payer: 59

## 2022-04-07 ENCOUNTER — Encounter: Payer: Self-pay | Admitting: Family Medicine

## 2022-04-07 ENCOUNTER — Ambulatory Visit: Payer: 59 | Admitting: Family Medicine

## 2022-04-07 VITALS — BP 120/82 | HR 72 | Ht 63.0 in | Wt 183.0 lb

## 2022-04-07 DIAGNOSIS — M545 Low back pain, unspecified: Secondary | ICD-10-CM

## 2022-04-07 DIAGNOSIS — M9902 Segmental and somatic dysfunction of thoracic region: Secondary | ICD-10-CM | POA: Diagnosis not present

## 2022-04-07 DIAGNOSIS — M9904 Segmental and somatic dysfunction of sacral region: Secondary | ICD-10-CM | POA: Diagnosis not present

## 2022-04-07 DIAGNOSIS — M9903 Segmental and somatic dysfunction of lumbar region: Secondary | ICD-10-CM

## 2022-04-07 DIAGNOSIS — N6313 Unspecified lump in the right breast, lower outer quadrant: Secondary | ICD-10-CM

## 2022-04-07 LAB — URINALYSIS
Bilirubin Urine: NEGATIVE
Hgb urine dipstick: NEGATIVE
Ketones, ur: NEGATIVE
Leukocytes,Ua: NEGATIVE
Nitrite: NEGATIVE
Specific Gravity, Urine: <=1.005 — AB (ref 1.000–1.030)
Total Protein, Urine: NEGATIVE
Urine Glucose: NEGATIVE
Urobilinogen, UA: 0.2 (ref 0.0–1.0)
pH: 7 (ref 5.0–8.0)

## 2022-04-07 NOTE — Assessment & Plan Note (Signed)
Low back pain.  Seems to be more of the sacroiliac joint noted than anything else at this time.  Unfortunately patient has had bowel calcium in her bile duct previously and could be potentially having a urinary tract infection or the possibility of a kidney stone.  We will get a KUB and a urinalysis to make sure.  Discussed home exercises and icing regimen otherwise.  Follow-up again in 4 to 6 weeks

## 2022-04-07 NOTE — Patient Instructions (Addendum)
Lab and Xray today Try to do exercises  Stay hydrated Continue vitamins See me in 6-8 weeks

## 2022-05-14 NOTE — Progress Notes (Deleted)
  [facility] [address] [place] [address] Phone: [phone] Subjective:    I'm seeing this patient by the request  of:  Roger Kill, PA-C  CC:   [id number]  Margaret Brady is a 56 y.o. female coming in with complaint of back and neck pain. OMT 04/07/2022. Patient states   Medications patient has been prescribed: None  Taking:         Reviewed prior external information including notes and imaging from previsou exam, outside providers and external EMR if available.   As well as notes that were available from care everywhere and other healthcare systems.  Past medical history, social, surgical and family history all reviewed in electronic medical record.  No pertanent information unless stated regarding to the chief complaint.   Past Medical History:  Diagnosis Date   Abnormal uterine bleeding    Anxiety    Arthritis    Complication of anesthesia    spinal with section n&v   Depression    hx postpartum dep   Headache(784.0)    Hormone disorder     No Known Allergies   Review of Systems:  No headache, visual changes, nausea, vomiting, diarrhea, constipation, dizziness, abdominal pain, skin rash, fevers, chills, night sweats, weight loss, swollen lymph nodes, body aches, joint swelling, chest pain, shortness of breath, mood changes. POSITIVE muscle aches  Objective  Last menstrual period 10/02/2015.   General: No apparent distress alert and oriented x3 mood and affect normal, dressed appropriately.  HEENT: Pupils equal, extraocular movements intact  Respiratory: Patient's speak in full sentences and does not appear short of breath  Cardiovascular: No lower extremity edema, non tender, no erythema  Gait MSK:  Back   Osteopathic findings  C2 flexed rotated and side bent right C6 flexed rotated and side bent left T3 extended rotated and side bent right inhaled rib T9 extended rotated and side bent left L2  flexed rotated and side bent right Sacrum right on right       Assessment and Plan:  No problem-specific Assessment & Plan notes found for this encounter.    Nonallopathic problems  Decision today to treat with OMT was based on Physical Exam  After verbal consent patient was treated with HVLA, ME, FPR techniques in cervical, rib, thoracic, lumbar, and sacral  areas  Patient tolerated the procedure well with improvement in symptoms  Patient given exercises, stretches and lifestyle modifications  See medications in patient instructions if given  Patient will follow up in 4-8 weeks             Note: This dictation was prepared with Dragon dictation along with smaller phrase technology. Any transcriptional errors that result from this process are unintentional.

## 2022-05-19 ENCOUNTER — Ambulatory Visit: Payer: 59 | Admitting: Family Medicine

## 2022-09-24 ENCOUNTER — Ambulatory Visit: Payer: 59

## 2022-09-24 ENCOUNTER — Ambulatory Visit
Admission: RE | Admit: 2022-09-24 | Discharge: 2022-09-24 | Disposition: A | Discharge status: Home / Self Care | Payer: 59 | Source: Ambulatory Visit | Attending: Family Medicine | Admitting: Family Medicine

## 2022-09-24 DIAGNOSIS — N6313 Unspecified lump in the right breast, lower outer quadrant: Secondary | ICD-10-CM
